# Patient Record
Sex: Female | Born: 1935 | Race: White | Hispanic: No | Marital: Single | Smoking: Never smoker
Health system: Southern US, Community
[De-identification: ages and names within clinical notes are randomized; demographics above are authoritative.]

## PROBLEM LIST (undated history)

## (undated) DIAGNOSIS — R42 Dizziness and giddiness: Secondary | ICD-10-CM

---

## 2007-09-24 ENCOUNTER — Encounter: Admission: RE | Admit: 2007-09-24 | Discharge: 2007-12-23 | Payer: Self-pay | Admitting: Family Medicine

## 2010-08-30 DEATH — deceased

## 2012-02-19 DIAGNOSIS — I10 Essential (primary) hypertension: Secondary | ICD-10-CM | POA: Diagnosis not present

## 2012-02-19 DIAGNOSIS — Z1331 Encounter for screening for depression: Secondary | ICD-10-CM | POA: Diagnosis not present

## 2012-02-19 DIAGNOSIS — E7800 Pure hypercholesterolemia, unspecified: Secondary | ICD-10-CM | POA: Diagnosis not present

## 2012-02-19 DIAGNOSIS — F411 Generalized anxiety disorder: Secondary | ICD-10-CM | POA: Diagnosis not present

## 2012-03-13 ENCOUNTER — Encounter (HOSPITAL_BASED_OUTPATIENT_CLINIC_OR_DEPARTMENT_OTHER): Payer: Self-pay | Admitting: *Deleted

## 2012-03-13 ENCOUNTER — Emergency Department (HOSPITAL_BASED_OUTPATIENT_CLINIC_OR_DEPARTMENT_OTHER)
Admission: EM | Admit: 2012-03-13 | Discharge: 2012-03-13 | Disposition: A | Discharge status: Home / Self Care | Payer: Medicare Other | Attending: Emergency Medicine | Admitting: Emergency Medicine

## 2012-03-13 ENCOUNTER — Emergency Department (HOSPITAL_BASED_OUTPATIENT_CLINIC_OR_DEPARTMENT_OTHER): Payer: Medicare Other

## 2012-03-13 DIAGNOSIS — E7800 Pure hypercholesterolemia, unspecified: Secondary | ICD-10-CM | POA: Diagnosis not present

## 2012-03-13 DIAGNOSIS — I10 Essential (primary) hypertension: Secondary | ICD-10-CM | POA: Insufficient documentation

## 2012-03-13 DIAGNOSIS — S82899A Other fracture of unspecified lower leg, initial encounter for closed fracture: Secondary | ICD-10-CM | POA: Diagnosis not present

## 2012-03-13 DIAGNOSIS — M25579 Pain in unspecified ankle and joints of unspecified foot: Secondary | ICD-10-CM | POA: Insufficient documentation

## 2012-03-13 DIAGNOSIS — S82892A Other fracture of left lower leg, initial encounter for closed fracture: Secondary | ICD-10-CM

## 2012-03-13 DIAGNOSIS — M7989 Other specified soft tissue disorders: Secondary | ICD-10-CM | POA: Diagnosis not present

## 2012-03-13 DIAGNOSIS — S8263XA Displaced fracture of lateral malleolus of unspecified fibula, initial encounter for closed fracture: Secondary | ICD-10-CM | POA: Diagnosis not present

## 2012-03-13 DIAGNOSIS — X500XXA Overexertion from strenuous movement or load, initial encounter: Secondary | ICD-10-CM | POA: Insufficient documentation

## 2012-03-13 MED ORDER — HYDROCODONE-ACETAMINOPHEN 5-325 MG PO TABS: 1.0000 | ORAL_TABLET | Freq: Four times a day (QID) | ORAL | Status: AC | PRN

## 2012-03-13 NOTE — ED Notes (Signed)
Pt initially refused splint and crutches-in with EDP [place] to talk with pt-pt agreeable to splint and rx for walker

## 2012-03-13 NOTE — ED Notes (Signed)
Pt refused w/c to tx area-slow steady gait

## 2012-03-13 NOTE — ED Provider Notes (Signed)
History     CSN: [id number]  Arrival date & time 03/18/12  1859   First MD Initiated Contact with Patient Mar 18, 2012 1910      Chief Complaint  Patient presents with  . Ankle Injury    (Consider location/radiation/quality/duration/timing/severity/associated sxs/prior treatment) HPI This is a 78 year old white female who was getting up out of her bed just prior to arrival. In the process she hyperextended her left ankle. There is now moderate pain in the left ankle with ecchymosis and swelling. Pain is worse with movement or palpation. She is able to bear weight on it. She denies other injury.  Past Medical History  Diagnosis Date  . Hypertension   . High cholesterol   . Anxiety     History reviewed. No pertinent past surgical history.  No family history on file.  History  Substance Use Topics  . Smoking status: Never Smoker   . Smokeless tobacco: Not on file  . Alcohol Use: No    OB History    Grav Para Term Preterm Abortions TAB SAB Ect Mult Living                  Review of Systems  All other systems reviewed and are negative.    Allergies  Review of patient's allergies indicates not on file.  Home Medications  No current outpatient prescriptions on file.  BP 199/78  Pulse 75  Temp(Src) 98.7 F (37.1 C) (Oral)  Resp 16  Ht 5\' 5"  (1.651 m)  Wt 141 lb (63.957 kg)  BMI 23.46 kg/m2  SpO2 98%  Physical Exam General: Well-developed, well-nourished female in no acute distress; appearance consistent with age of record HENT: normocephalic, atraumatic Eyes: pupils equal round and reactive to light; extraocular muscles intact Neck: supple Heart: regular rate and rhythm Lungs: clear to auscultation bilaterally Abdomen: soft; nondistended Extremities: Pulses normal; tenderness, ecchymosis and swelling over left lateral malleolus; left foot distally neurovascularly intact Neurologic: Awake, alert and oriented; motor function intact in all extremities and  symmetric; no facial droop Skin: Warm and dry     ED Course  Procedures (including critical care time)     MDM  Nursing notes and vitals signs, including pulse oximetry, reviewed.  Summary of this visit's results, reviewed by myself:  Labs:  No results found for this or any previous visit.  Imaging Studies: Dg Ankle Complete Left  Mar 18, 2012  *RADIOLOGY REPORT*  Clinical Data: Lateral ankle pain with bruising and swelling.  LEFT ANKLE COMPLETE - 3+ VIEW  Comparison: None.  Findings: There is prominent soft tissue swelling over the anterolateral aspect of the ankle. Widening of the lateral aspect of the tibiotalar joint.  Possible tiny avulsion fracture off the inferior aspect of the lateral malleolus.  IMPRESSION:  1.  Marked soft tissue swelling over the anterolateral aspect of the ankle with a possible tiny avulsion fracture off the inferior aspect of the lateral malleolus. 2.  Widening of the lateral aspect of the tibiotalar joint.  Original Report Authenticated By: Reyes Ivan, M.D.            Hanley Seamen, MD Mar 18, 2012 [phone]

## 2012-03-13 NOTE — ED Notes (Signed)
Twisted left ankle when stood approx PTA

## 2012-03-13 NOTE — Discharge Instructions (Signed)
Ankle Fracture A fracture is a break in the bone. A cast or splint is used to protect and keep your injured bone from moving.  HOME CARE INSTRUCTIONS   Use your crutches as directed.   To lessen the swelling, keep the injured leg elevated while sitting or lying down.   Apply ice to the injury for 15 to 20 minutes, 3 to 4 times per day while awake for 2 days. Put the ice in a plastic bag and place a thin towel between the bag of ice and your cast.   If you have a plaster or fiberglass cast:   Do not try to scratch the skin under the cast using sharp or pointed objects.   Check the skin around the cast every day. You may put lotion on any red or sore areas.   Keep your cast dry and clean.   If you have a plaster splint:   Wear the splint as directed.   You may loosen the elastic around the splint if your toes become numb, tingle, or turn cold or blue.   Do not put pressure on any part of your cast or splint; it may break. Rest your cast only on a pillow the first 24 hours until it is fully hardened.   Your cast or splint can be protected during bathing with a plastic bag. Do not lower the cast or splint into water.   Take medications as directed by your caregiver. Only take over-the-counter or prescription medicines for pain, discomfort, or fever as directed by your caregiver.   Do not drive a vehicle until your caregiver specifically tells you it is safe to do so.   If your caregiver has given you a follow-up appointment, it is very important to keep that appointment. Not keeping the appointment could result in a chronic or permanent injury, pain, and disability. If there is any problem keeping the appointment, you must call back to this facility for assistance.  SEEK IMMEDIATE MEDICAL CARE IF:   Your splint gets damaged or breaks.   You have continued severe pain or more swelling than you did before the cast was put on.   Your skin or toenails below the injury turn blue or  gray, or feel cold or numb.   There is a bad smell or new stains and/or purulent (pus like) drainage coming from under the cast.  If you do not have a window in your cast for observing the wound, a discharge or minor bleeding may show up as a stain on the outside of your cast. Report these findings to your caregiver. MAKE SURE YOU:   Understand these instructions.   Will watch your condition.   Will get help right away if you are not doing well or get worse.  Document Released: 10/13/2000 Document Revised: 10/05/2011 Document Reviewed: 05/19/2008 [facility] Patient Information 2012 [place], [place].

## 2012-03-13 NOTE — ED Notes (Signed)
Pt cont'd anxious-advised daughter to recheck BP when pt less anxious-agreeable and states pt has home BP monitor-pt assisted to BR via w/c just prior to d/c

## 2012-03-15 DIAGNOSIS — S96819A Strain of other specified muscles and tendons at ankle and foot level, unspecified foot, initial encounter: Secondary | ICD-10-CM | POA: Diagnosis not present

## 2012-03-15 DIAGNOSIS — S93499A Sprain of other ligament of unspecified ankle, initial encounter: Secondary | ICD-10-CM | POA: Diagnosis not present

## 2012-03-27 DIAGNOSIS — H4050X0 Glaucoma secondary to other eye disorders, unspecified eye, stage unspecified: Secondary | ICD-10-CM | POA: Diagnosis not present

## 2012-03-27 DIAGNOSIS — H2510 Age-related nuclear cataract, unspecified eye: Secondary | ICD-10-CM | POA: Diagnosis not present

## 2012-03-27 DIAGNOSIS — H409 Unspecified glaucoma: Secondary | ICD-10-CM | POA: Diagnosis not present

## 2012-04-01 DIAGNOSIS — H4050X0 Glaucoma secondary to other eye disorders, unspecified eye, stage unspecified: Secondary | ICD-10-CM | POA: Diagnosis not present

## 2012-04-03 DIAGNOSIS — H2510 Age-related nuclear cataract, unspecified eye: Secondary | ICD-10-CM | POA: Diagnosis not present

## 2012-04-03 DIAGNOSIS — S96819A Strain of other specified muscles and tendons at ankle and foot level, unspecified foot, initial encounter: Secondary | ICD-10-CM | POA: Diagnosis not present

## 2012-04-03 DIAGNOSIS — S93499A Sprain of other ligament of unspecified ankle, initial encounter: Secondary | ICD-10-CM | POA: Diagnosis not present

## 2012-04-10 DIAGNOSIS — IMO0002 Reserved for concepts with insufficient information to code with codable children: Secondary | ICD-10-CM | POA: Diagnosis not present

## 2012-04-10 DIAGNOSIS — H2510 Age-related nuclear cataract, unspecified eye: Secondary | ICD-10-CM | POA: Diagnosis not present

## 2012-04-10 DIAGNOSIS — H269 Unspecified cataract: Secondary | ICD-10-CM | POA: Diagnosis not present

## 2012-04-18 DIAGNOSIS — H2510 Age-related nuclear cataract, unspecified eye: Secondary | ICD-10-CM | POA: Diagnosis not present

## 2012-05-13 DIAGNOSIS — H2510 Age-related nuclear cataract, unspecified eye: Secondary | ICD-10-CM | POA: Diagnosis not present

## 2012-05-13 DIAGNOSIS — IMO0002 Reserved for concepts with insufficient information to code with codable children: Secondary | ICD-10-CM | POA: Diagnosis not present

## 2012-05-13 DIAGNOSIS — H269 Unspecified cataract: Secondary | ICD-10-CM | POA: Diagnosis not present

## 2012-05-13 DIAGNOSIS — H278 Other specified disorders of lens: Secondary | ICD-10-CM | POA: Diagnosis not present

## 2012-05-14 ENCOUNTER — Encounter (INDEPENDENT_AMBULATORY_CARE_PROVIDER_SITE_OTHER): Payer: Medicare Other | Admitting: Ophthalmology

## 2012-05-14 DIAGNOSIS — I10 Essential (primary) hypertension: Secondary | ICD-10-CM | POA: Diagnosis not present

## 2012-05-14 DIAGNOSIS — H35039 Hypertensive retinopathy, unspecified eye: Secondary | ICD-10-CM

## 2012-05-14 DIAGNOSIS — H53009 Unspecified amblyopia, unspecified eye: Secondary | ICD-10-CM

## 2012-05-14 DIAGNOSIS — H43819 Vitreous degeneration, unspecified eye: Secondary | ICD-10-CM

## 2012-05-14 DIAGNOSIS — H1820 Unspecified corneal edema: Secondary | ICD-10-CM | POA: Diagnosis not present

## 2012-07-08 DIAGNOSIS — Z23 Encounter for immunization: Secondary | ICD-10-CM | POA: Diagnosis not present

## 2012-08-12 DIAGNOSIS — H40039 Anatomical narrow angle, unspecified eye: Secondary | ICD-10-CM | POA: Diagnosis not present

## 2012-08-23 DIAGNOSIS — D485 Neoplasm of uncertain behavior of skin: Secondary | ICD-10-CM | POA: Diagnosis not present

## 2012-08-23 DIAGNOSIS — I10 Essential (primary) hypertension: Secondary | ICD-10-CM | POA: Diagnosis not present

## 2012-08-23 DIAGNOSIS — F411 Generalized anxiety disorder: Secondary | ICD-10-CM | POA: Diagnosis not present

## 2012-08-23 DIAGNOSIS — D0439 Carcinoma in situ of skin of other parts of face: Secondary | ICD-10-CM | POA: Diagnosis not present

## 2012-08-23 DIAGNOSIS — E7800 Pure hypercholesterolemia, unspecified: Secondary | ICD-10-CM | POA: Diagnosis not present

## 2012-09-19 DIAGNOSIS — D049 Carcinoma in situ of skin, unspecified: Secondary | ICD-10-CM | POA: Diagnosis not present

## 2012-09-19 DIAGNOSIS — L570 Actinic keratosis: Secondary | ICD-10-CM | POA: Diagnosis not present

## 2013-02-13 DIAGNOSIS — H4011X0 Primary open-angle glaucoma, stage unspecified: Secondary | ICD-10-CM | POA: Diagnosis not present

## 2013-02-13 DIAGNOSIS — H409 Unspecified glaucoma: Secondary | ICD-10-CM | POA: Diagnosis not present

## 2013-02-21 DIAGNOSIS — E7800 Pure hypercholesterolemia, unspecified: Secondary | ICD-10-CM | POA: Diagnosis not present

## 2013-02-21 DIAGNOSIS — I10 Essential (primary) hypertension: Secondary | ICD-10-CM | POA: Diagnosis not present

## 2013-02-21 DIAGNOSIS — Z1331 Encounter for screening for depression: Secondary | ICD-10-CM | POA: Diagnosis not present

## 2013-02-21 DIAGNOSIS — M159 Polyosteoarthritis, unspecified: Secondary | ICD-10-CM | POA: Diagnosis not present

## 2013-02-21 DIAGNOSIS — F411 Generalized anxiety disorder: Secondary | ICD-10-CM | POA: Diagnosis not present

## 2013-06-19 DIAGNOSIS — H409 Unspecified glaucoma: Secondary | ICD-10-CM | POA: Diagnosis not present

## 2013-06-19 DIAGNOSIS — H4011X0 Primary open-angle glaucoma, stage unspecified: Secondary | ICD-10-CM | POA: Diagnosis not present

## 2013-07-09 DIAGNOSIS — Z23 Encounter for immunization: Secondary | ICD-10-CM | POA: Diagnosis not present

## 2013-08-04 DIAGNOSIS — I10 Essential (primary) hypertension: Secondary | ICD-10-CM | POA: Diagnosis not present

## 2013-08-04 DIAGNOSIS — F411 Generalized anxiety disorder: Secondary | ICD-10-CM | POA: Diagnosis not present

## 2013-08-04 DIAGNOSIS — E7800 Pure hypercholesterolemia, unspecified: Secondary | ICD-10-CM | POA: Diagnosis not present

## 2013-08-06 DIAGNOSIS — H409 Unspecified glaucoma: Secondary | ICD-10-CM | POA: Diagnosis not present

## 2013-08-06 DIAGNOSIS — H4011X0 Primary open-angle glaucoma, stage unspecified: Secondary | ICD-10-CM | POA: Diagnosis not present

## 2013-09-17 DIAGNOSIS — H40039 Anatomical narrow angle, unspecified eye: Secondary | ICD-10-CM | POA: Diagnosis not present

## 2013-10-24 DIAGNOSIS — F411 Generalized anxiety disorder: Secondary | ICD-10-CM | POA: Diagnosis not present

## 2013-10-24 DIAGNOSIS — R42 Dizziness and giddiness: Secondary | ICD-10-CM | POA: Diagnosis not present

## 2013-12-22 DIAGNOSIS — H4011X0 Primary open-angle glaucoma, stage unspecified: Secondary | ICD-10-CM | POA: Diagnosis not present

## 2013-12-22 DIAGNOSIS — H409 Unspecified glaucoma: Secondary | ICD-10-CM | POA: Diagnosis not present

## 2014-02-02 DIAGNOSIS — F411 Generalized anxiety disorder: Secondary | ICD-10-CM | POA: Diagnosis not present

## 2014-02-02 DIAGNOSIS — E7800 Pure hypercholesterolemia, unspecified: Secondary | ICD-10-CM | POA: Diagnosis not present

## 2014-02-02 DIAGNOSIS — I10 Essential (primary) hypertension: Secondary | ICD-10-CM | POA: Diagnosis not present

## 2014-04-23 DIAGNOSIS — H409 Unspecified glaucoma: Secondary | ICD-10-CM | POA: Diagnosis not present

## 2014-04-23 DIAGNOSIS — H4011X0 Primary open-angle glaucoma, stage unspecified: Secondary | ICD-10-CM | POA: Diagnosis not present

## 2014-07-07 DIAGNOSIS — Z23 Encounter for immunization: Secondary | ICD-10-CM | POA: Diagnosis not present

## 2014-08-13 DIAGNOSIS — I493 Ventricular premature depolarization: Secondary | ICD-10-CM | POA: Diagnosis not present

## 2014-08-13 DIAGNOSIS — I10 Essential (primary) hypertension: Secondary | ICD-10-CM | POA: Diagnosis not present

## 2014-08-13 DIAGNOSIS — F419 Anxiety disorder, unspecified: Secondary | ICD-10-CM | POA: Diagnosis not present

## 2014-08-13 DIAGNOSIS — M150 Primary generalized (osteo)arthritis: Secondary | ICD-10-CM | POA: Diagnosis not present

## 2014-08-13 DIAGNOSIS — E780 Pure hypercholesterolemia: Secondary | ICD-10-CM | POA: Diagnosis not present

## 2014-10-01 DIAGNOSIS — H04123 Dry eye syndrome of bilateral lacrimal glands: Secondary | ICD-10-CM | POA: Diagnosis not present

## 2014-10-01 DIAGNOSIS — H4011X1 Primary open-angle glaucoma, mild stage: Secondary | ICD-10-CM | POA: Diagnosis not present

## 2015-02-11 DIAGNOSIS — I10 Essential (primary) hypertension: Secondary | ICD-10-CM | POA: Diagnosis not present

## 2015-02-11 DIAGNOSIS — F419 Anxiety disorder, unspecified: Secondary | ICD-10-CM | POA: Diagnosis not present

## 2015-02-11 DIAGNOSIS — E780 Pure hypercholesterolemia: Secondary | ICD-10-CM | POA: Diagnosis not present

## 2015-04-06 DIAGNOSIS — H4011X1 Primary open-angle glaucoma, mild stage: Secondary | ICD-10-CM | POA: Diagnosis not present

## 2015-05-07 ENCOUNTER — Emergency Department (HOSPITAL_BASED_OUTPATIENT_CLINIC_OR_DEPARTMENT_OTHER)
Admission: EM | Admit: 2015-05-07 | Discharge: 2015-05-07 | Disposition: A | Discharge status: Home / Self Care | Payer: Medicare Other | Attending: Emergency Medicine | Admitting: Emergency Medicine

## 2015-05-07 ENCOUNTER — Encounter (HOSPITAL_BASED_OUTPATIENT_CLINIC_OR_DEPARTMENT_OTHER): Payer: Self-pay | Admitting: *Deleted

## 2015-05-07 DIAGNOSIS — I10 Essential (primary) hypertension: Secondary | ICD-10-CM | POA: Diagnosis not present

## 2015-05-07 DIAGNOSIS — Y92008 Other place in unspecified non-institutional (private) residence as the place of occurrence of the external cause: Secondary | ICD-10-CM | POA: Diagnosis not present

## 2015-05-07 DIAGNOSIS — Z79899 Other long term (current) drug therapy: Secondary | ICD-10-CM | POA: Insufficient documentation

## 2015-05-07 DIAGNOSIS — Y9389 Activity, other specified: Secondary | ICD-10-CM | POA: Diagnosis not present

## 2015-05-07 DIAGNOSIS — E780 Pure hypercholesterolemia: Secondary | ICD-10-CM | POA: Diagnosis not present

## 2015-05-07 DIAGNOSIS — Y998 Other external cause status: Secondary | ICD-10-CM | POA: Insufficient documentation

## 2015-05-07 DIAGNOSIS — Z7982 Long term (current) use of aspirin: Secondary | ICD-10-CM | POA: Insufficient documentation

## 2015-05-07 DIAGNOSIS — Z880 Allergy status to penicillin: Secondary | ICD-10-CM | POA: Diagnosis not present

## 2015-05-07 DIAGNOSIS — S81812A Laceration without foreign body, left lower leg, initial encounter: Secondary | ICD-10-CM | POA: Diagnosis not present

## 2015-05-07 DIAGNOSIS — F419 Anxiety disorder, unspecified: Secondary | ICD-10-CM | POA: Insufficient documentation

## 2015-05-07 DIAGNOSIS — S81802A Unspecified open wound, left lower leg, initial encounter: Secondary | ICD-10-CM | POA: Insufficient documentation

## 2015-05-07 DIAGNOSIS — W108XXA Fall (on) (from) other stairs and steps, initial encounter: Secondary | ICD-10-CM | POA: Diagnosis not present

## 2015-05-07 NOTE — ED Provider Notes (Signed)
CSN: [id number]     Arrival date & time 05/07/15  1731 History   First MD Initiated Contact with Patient 05/07/15 1821     Chief Complaint  Patient presents with  . Extremity Laceration     (Consider location/radiation/quality/duration/timing/severity/associated sxs/prior Treatment) The history is provided by the patient and medical records.   81 y.o. F with hx of HTN, HLP, anxiety, presenting to the ED for left lower leg abrasions that happened yesterday.  Patient states she was carrying her small cocker spaniel of the stairs when she tripped and hit her shin against the concrete steps of her porch. She denies any head injury or loss of consciousness. She states her neighbor helped bandage her wounds, but she has had issues of intermittent bleeding since this time. She denies any leg pain or difficulty walking. No numbness or weakness.  Patient cannot receive tetanus vaccinations as she has an allergic reaction.  Past Medical History  Diagnosis Date  . Hypertension   . High cholesterol   . Anxiety    History reviewed. No pertinent past surgical history. History reviewed. No pertinent family history. History  Substance Use Topics  . Smoking status: Never Smoker   . Smokeless tobacco: Not on file  . Alcohol Use: No   OB History    No data available     Review of Systems  Skin: Positive for wound.  All other systems reviewed and are negative.     Allergies  Penicillins and Tetanus toxoids  Home Medications   Prior to Admission medications   Medication Sig Start Date End Date Taking? Authorizing Provider  aspirin 81 MG tablet Take 81 mg by mouth daily.    Historical Provider, MD  clorazepate (TRANXENE) 7.5 MG tablet Take 7.5 mg by mouth 2 (two) times daily as needed.    Historical Provider, MD  pravastatin (PRAVACHOL) 20 MG tablet Take 20 mg by mouth daily.    Historical Provider, MD  verapamil (CALAN-SR) 120 MG CR tablet Take 120 mg by mouth at bedtime.    Historical  Provider, MD   BP 187/89 mmHg  Pulse 46  Temp(Src) 98.4 F (36.9 C) (Oral)  Resp 16  Ht 5\' 5"  (1.651 m)  Wt 140 lb (63.504 kg)  BMI 23.30 kg/m2  SpO2 98%   Physical Exam  Constitutional: She is oriented to person, place, and time. She appears well-developed and well-nourished. No distress.  HENT:  Head: Normocephalic and atraumatic.  Mouth/Throat: Oropharynx is clear and moist.  Eyes: Conjunctivae and EOM are normal. Pupils are equal, round, and reactive to light.  Neck: Normal range of motion. Neck supple.  Cardiovascular: Normal rate, regular rhythm and normal heart sounds.   Pulmonary/Chest: Effort normal and breath sounds normal. No respiratory distress. She has no wheezes.  Musculoskeletal: Normal range of motion.  Left anterior shin with approximately 6 cm linear abrasion without active bleeding; there is no bruising, swelling, or bony deformity noted; leg is NVI; normal gait  Neurological: She is alert and oriented to person, place, and time.  Skin: Skin is warm and dry. She is not diaphoretic.  Psychiatric: She has a normal mood and affect.  Nursing note and vitals reviewed.   ED Course  Procedures (including critical care time)  LACERATION REPAIR Performed by: Larene Pickett Authorized by: Larene Pickett Consent: Verbal consent obtained. Risks and benefits: risks, benefits and alternatives were discussed Consent given by: patient Patient identity confirmed: provided demographic data Prepped and Draped in normal sterile  fashion Wound explored  Laceration Location: left shin  Laceration Length: 6cm, superficial abrasion  No Foreign Bodies seen or palpated  Anesthesia: none  Local anesthetic: none  Anesthetic total: 0 ml  Irrigation method: syringe Amount of cleaning: standard  Skin closure: steri-strips  Number of sutures: 0  Technique: n/a  Patient tolerance: Patient tolerated the procedure well with no immediate complications.  Labs  Review Labs Reviewed - No data to display  Imaging Review No results found.   EKG Interpretation None      MDM   Final diagnoses:  Leg wound, left, initial encounter   81 year old female with abrasions to left lower leg from a mechanical fall on steps yesterday. No head injury or loss of consciousness. She denies any leg pain, numbness, or weakness. Gait is normal. Abrasions not requiring sutures and wounds > 24 hours old at this time.  Wounds cleansed and Steri-Strips applied, bandage placed.  Instructed on home wound care.  Patient cannot receive tetanus vaccine.  Will FU with PCP as needed.  Discussed plan with patient, he/she acknowledged understanding and agreed with plan of care.  Return precautions given for new or worsening symptoms.  Larene Pickett, PA-C 05/07/15 [place], MD 05/07/15 [phone]

## 2015-05-07 NOTE — ED Notes (Signed)
PA at bedside.

## 2015-05-07 NOTE — ED Notes (Signed)
Wound care completed by PA.  Wound is dressed and pt is ready to go.

## 2015-05-07 NOTE — Discharge Instructions (Signed)
Leave Steri-Strips in place for the next several days. Change dressing as needed at home. Return here as needed for any new concerns.

## 2015-05-07 NOTE — ED Notes (Signed)
Pt c/o laceration to left lower leg x 1day ago

## 2015-07-20 DIAGNOSIS — Z23 Encounter for immunization: Secondary | ICD-10-CM | POA: Diagnosis not present

## 2015-08-16 DIAGNOSIS — E7800 Pure hypercholesterolemia, unspecified: Secondary | ICD-10-CM | POA: Diagnosis not present

## 2015-08-16 DIAGNOSIS — F430 Acute stress reaction: Secondary | ICD-10-CM | POA: Diagnosis not present

## 2015-08-16 DIAGNOSIS — I10 Essential (primary) hypertension: Secondary | ICD-10-CM | POA: Diagnosis not present

## 2015-08-16 DIAGNOSIS — F419 Anxiety disorder, unspecified: Secondary | ICD-10-CM | POA: Diagnosis not present

## 2015-09-28 ENCOUNTER — Emergency Department (HOSPITAL_BASED_OUTPATIENT_CLINIC_OR_DEPARTMENT_OTHER)
Admission: EM | Admit: 2015-09-28 | Discharge: 2015-09-28 | Disposition: A | Discharge status: Home / Self Care | Payer: Medicare Other | Attending: Emergency Medicine | Admitting: Emergency Medicine

## 2015-09-28 ENCOUNTER — Emergency Department (HOSPITAL_BASED_OUTPATIENT_CLINIC_OR_DEPARTMENT_OTHER): Payer: Medicare Other

## 2015-09-28 ENCOUNTER — Encounter (HOSPITAL_BASED_OUTPATIENT_CLINIC_OR_DEPARTMENT_OTHER): Payer: Self-pay | Admitting: *Deleted

## 2015-09-28 DIAGNOSIS — E7800 Pure hypercholesterolemia, unspecified: Secondary | ICD-10-CM | POA: Insufficient documentation

## 2015-09-28 DIAGNOSIS — S42202A Unspecified fracture of upper end of left humerus, initial encounter for closed fracture: Secondary | ICD-10-CM | POA: Diagnosis not present

## 2015-09-28 DIAGNOSIS — S42292A Other displaced fracture of upper end of left humerus, initial encounter for closed fracture: Secondary | ICD-10-CM | POA: Diagnosis not present

## 2015-09-28 DIAGNOSIS — Z7982 Long term (current) use of aspirin: Secondary | ICD-10-CM | POA: Diagnosis not present

## 2015-09-28 DIAGNOSIS — W010XXA Fall on same level from slipping, tripping and stumbling without subsequent striking against object, initial encounter: Secondary | ICD-10-CM | POA: Diagnosis not present

## 2015-09-28 DIAGNOSIS — Z880 Allergy status to penicillin: Secondary | ICD-10-CM | POA: Insufficient documentation

## 2015-09-28 DIAGNOSIS — Y998 Other external cause status: Secondary | ICD-10-CM | POA: Diagnosis not present

## 2015-09-28 DIAGNOSIS — S4992XA Unspecified injury of left shoulder and upper arm, initial encounter: Secondary | ICD-10-CM | POA: Diagnosis present

## 2015-09-28 DIAGNOSIS — Y93K1 Activity, walking an animal: Secondary | ICD-10-CM | POA: Insufficient documentation

## 2015-09-28 DIAGNOSIS — F419 Anxiety disorder, unspecified: Secondary | ICD-10-CM | POA: Diagnosis not present

## 2015-09-28 DIAGNOSIS — Z79899 Other long term (current) drug therapy: Secondary | ICD-10-CM | POA: Insufficient documentation

## 2015-09-28 DIAGNOSIS — I10 Essential (primary) hypertension: Secondary | ICD-10-CM | POA: Diagnosis not present

## 2015-09-28 DIAGNOSIS — Y9289 Other specified places as the place of occurrence of the external cause: Secondary | ICD-10-CM | POA: Insufficient documentation

## 2015-09-28 HISTORY — DX: Dizziness and giddiness: R42

## 2015-09-28 MED ORDER — IBUPROFEN 200 MG PO TABS
ORAL_TABLET | ORAL | Status: AC
  Filled 2015-09-28: qty 1

## 2015-09-28 MED ORDER — POLYETHYLENE GLYCOL 3350 17 G PO PACK: 17.0000 g | PACK | Freq: Every day | ORAL | Status: AC

## 2015-09-28 MED ORDER — TRAMADOL HCL 50 MG PO TABS: 50.0000 mg | ORAL_TABLET | Freq: Four times a day (QID) | ORAL | Status: DC | PRN

## 2015-09-28 MED ORDER — IBUPROFEN 400 MG PO TABS
ORAL_TABLET | ORAL | Status: AC
  Filled 2015-09-28: qty 1

## 2015-09-28 MED ORDER — IBUPROFEN 400 MG PO TABS
600.0000 mg | ORAL_TABLET | Freq: Once | ORAL | Status: AC
  Administered 2015-09-28: 600 mg via ORAL

## 2015-09-28 MED ORDER — DOCUSATE SODIUM 100 MG PO CAPS: 100.0000 mg | ORAL_CAPSULE | Freq: Two times a day (BID) | ORAL | Status: AC

## 2015-09-28 NOTE — ED Provider Notes (Signed)
CSN: [id number]     Arrival date & time 09/28/15  [id number] History   First MD Initiated Contact with Patient 09/28/15 0902     Chief Complaint  Patient presents with  . Fall     (Consider location/radiation/quality/duration/timing/severity/associated sxs/prior Treatment) The history is provided by the patient.   patient presents after a fall. States around 2 in the morning she was walking her dog and tripped and fell into the door frame. Comparing of pain in the left shoulder. No other injury. No numbness weakness. She states she did not hit her head. Pain with movement of the left shoulder. No chest pain or trouble breathing. No headache. No confusion. She is not on anticoagulation.  Past Medical History  Diagnosis Date  . Hypertension   . High cholesterol   . Anxiety   . Dizziness    History reviewed. No pertinent past surgical history. No family history on file. Social History  Substance Use Topics  . Smoking status: Never Smoker   . Smokeless tobacco: None  . Alcohol Use: No   OB History    No data available     Review of Systems  Constitutional: Negative for appetite change.  Respiratory: Negative for shortness of breath.   Cardiovascular: Negative for chest pain.  Musculoskeletal: Positive for joint swelling. Negative for back pain and gait problem.  Skin: Negative for wound.  Neurological: Negative for weakness and light-headedness.      Allergies  Penicillins and Tetanus toxoids  Home Medications   Prior to Admission medications   Medication Sig Start Date End Date Taking? Authorizing Provider  amLODipine-benazepril (LOTREL) 5-20 MG capsule Take 1 capsule by mouth daily.   Yes Historical Provider, MD  aspirin 81 MG tablet Take 81 mg by mouth daily.   Yes Historical Provider, MD  clorazepate (TRANXENE) 7.5 MG tablet Take 7.5 mg by mouth 2 (two) times daily as needed.   Yes Historical Provider, MD  pravastatin (PRAVACHOL) 20 MG tablet Take 20 mg by mouth daily.    Yes Historical Provider, MD  docusate sodium (COLACE) 100 MG capsule Take 1 capsule (100 mg total) by mouth every 12 (twelve) hours. 09/28/15   Davonna Belling, MD  polyethylene glycol [facility] / GLYCOLAX) packet Take 17 g by mouth daily. 09/28/15   Davonna Belling, MD  traMADol (ULTRAM) 50 MG tablet Take 1 tablet (50 mg total) by mouth every 6 (six) hours as needed. 09/28/15   Davonna Belling, MD   BP 170/85 mmHg  Pulse 92  Temp(Src) 98.3 F (36.8 C) (Oral)  Resp 16  Ht 5\' 5"  (1.651 m)  Wt 130 lb (58.968 kg)  BMI 21.63 kg/m2  SpO2 99% Physical Exam  Constitutional: She appears well-developed.  HENT:  Head: Atraumatic.  Neck: Normal range of motion. Neck supple.  Cardiovascular: Normal rate.   Pulmonary/Chest: Effort normal.  Abdominal: Soft.  Musculoskeletal: She exhibits tenderness.  Tenderness and swelling to left proximal upper arm. Some fullness in the deltoid area. Neurovascular intact in hand. No tenderness of elbow. Decreased range of motion shoulder with some grinding.  Neurological: She is alert.  Skin: Skin is warm.    ED Course  Procedures (including critical care time) Labs Review Labs Reviewed - No data to display  Imaging Review Ct Shoulder Left Wo Contrast  09/28/2015  CLINICAL DATA:  Evaluate proximal humeral head/ neck fracture, left shoulder. EXAM: CT OF THE LEFT SHOULDER WITHOUT CONTRAST TECHNIQUE: Multidetector CT imaging was performed according to the standard protocol. Multiplanar CT  image reconstructions were also generated. COMPARISON:  Radiographs 09/28/2015 FINDINGS: There is a severely comminuted and complex fracture involving the humeral head and neck. There is a transverse fracture through the humeral neck and a vertical fracture through the greater tuberosity. The lesser tuberosity is also fractured. The humeral head is split and the largest component of the articular surface is rotated posteriorly and impacted in the humeral neck. Multiple  loose bone fragments are noted in the joint space. Possible small fracture involving the superior margin of the glenoid. The [facility] joint is intact with moderate degenerative changes. No scapular fracture. No rib fractures are seen. The visualized left lung is unremarkable. IMPRESSION: Severely comminuted and complex fracture involving the humeral head neck. Multipart fracture with a head split. The largest component of the articular surface is rotated posteriorly and not articulating with the glenoid. Please see 3D images. Multiple small fracture fragments in the joint. Possible small fracture involving the superior margin of the glenoid posteriorly. Electronically Signed   By: Marijo Sanes M.D.   On: 09/28/2015 11:43   Dg Shoulder Left  09/28/2015  CLINICAL DATA:  Fall today. EXAM: LEFT SHOULDER - 2+ VIEW COMPARISON:  None. FINDINGS: . Acute fracture of the left humeral neck and left humeral head. There is impaction of the humeral head. Subluxation of the shoulder joint without dislocation. No other fracture. AC joint intact. IMPRESSION: Impacted fracture of the left humeral head. Fracture of the left humeral neck. Consider CT for further anatomic evaluation. Electronically Signed   By: Franchot Gallo M.D.   On: 09/28/2015 09:25   I have personally reviewed and evaluated these images and lab results as part of my medical decision-making.   EKG Interpretation None      MDM   Final diagnoses:  Proximal humerus fracture, left, closed, initial encounter   Patient with complex humeral fracture. Discussed with Dr. Amedeo Plenty, who suggested follow-up with Dr. suppler Dr. Tamela Oddi. CT scan was done here for operative planning. Will discharge with stool softeners and some pain medicine. Patient was placed in a shoulder immobilizer.     Davonna Belling, MD 09/28/15 [phone]

## 2015-09-28 NOTE — ED Notes (Signed)
MD at bedside. 

## 2015-09-28 NOTE — Discharge Instructions (Signed)
We have discussed with Dr Amedeo Plenty. He requests follow up with Dr Onnie Graham or Dr Veverly Fells.  Shoulder Fracture (Proximal Humerus or Glenoid) A shoulder fracture is a broken upper arm bone or a broken socket bone. The humerus is the upper arm bone and the glenoid is the shoulder socket. Proximal means the humerus is broken near the shoulder. Most of the time the bones of a broken shoulder are in an acceptable position. Usually, the injury can be treated with a shoulder immobilizer or sling and swath bandage. These devices support the arm and prevent any shoulder movement. If the bones are not in a good position, then surgery is sometimes needed. Shoulder fractures usually initially cause swelling, pain, and discoloration around the upper arm. They heal in 8 to 12 weeks with proper treatment. SYMPTOMS  At the time of injury:  Pain.  Tenderness.  Regular body contours are not normal. Later symptoms may include:  Swelling and bruising of the elbow and hand.  Swelling and bruising of the arm or chest. Other symptoms include:  Pain when lifting or turning the arm.  Paralysis below the fracture.  Numbness or coldness below the fracture. CAUSES   Indirect force from falling on an outstretched arm.  A blow to the shoulder. RISK INCREASES WITH:  Not being in shape.  Playing contact sports, such as football, soccer, hockey, or rugby.  Sports where falling on an outstretched arm occurs, such as basketball, skateboarding, or volleyball.  History of bone or joint disease.  History of shoulder injury. PREVENTION  Warm up before activity.  Stretch before activity.  Stay in shape with your:  Heart fitness.  Flexibility.  Shoulder Strength.  Falling with the proper technique. PROGNOSIS  In adults, healing time is about 7 weeks. For children, healing time is about 5 weeks. Surgery may be needed. RELATED COMPLICATIONS  The bones do not heal together (nonunion).  The bones do not  align properly when they heal (malunion).  Long-term problems with pain, stiffness, swelling, or loss of motion.  The injured arm heals shorter than the other.  Nerves are injured in the arm.  Arthritis in the shoulder.  Normal bone growth is interrupted in children.  Blood supply to the shoulder joint is diminished. TREATMENT If the bones are aligned, then initial treatment will be with ice and medicine to help with pain. The shoulder will be held in place with a sling (immobilization). The shoulder will be allowed to heal for up to 6 weeks. Injuries that may need surgery include:  Severe fractures.  Fractures that are not in appropriate alignment (displaced).  Non-displaced fractures (not common). Surgery helps the bones align correctly. The bones may be held in place with:  Sutures.  Wires.  Rods.  Plates.  Screws.  Pins. If you have had surgery or not, you will likely be assisted by a physical therapist or athletic trainer to get the best results with your injured shoulder. This will likely include exercises to strengthen and stretch the injured and surrounding areas. MEDICATION  If pain medicine is needed, nonsteroidal anti-inflammatory medicines (such as aspirin or ibuprofen) or other minor pain relievers (such as acetaminophen) are often advised.  Do not take pain medicine for 7 days before surgery.  Stronger pain relievers may be prescribed. Use only as directed and take only as much as you need. COLD THERAPY Cold treatment (icing) relieves pain and reduces inflammation. Cold treatment should be applied for 10 to 15 minutes every 2 to 3 hours,  and immediately after activity that aggravates your symptoms. Use ice packs or an ice massage. SEEK IMMEDIATE MEDICAL CARE IF:  You have severe shoulder pain unrelieved by rest and taking pain medicine.  You have pain, numbness, tingling, or weakness in the hand or wrist.  You have shortness of breath, chest pain,  severe weakness, or fainting.  You have severe pain with motion of the fingers or wrist.  Blue, gray, or dark color appears in the fingernails on injured extremity.   This information is not intended to replace advice given to you by your health care provider. Make sure you discuss any questions you have with your health care provider.   Document Released: 10/16/2005 Document Revised: 01/08/2012 Document Reviewed: 01/28/2009 Elsevier Interactive Patient Education [employer].

## 2015-09-28 NOTE — ED Notes (Signed)
States she was taking dog out at 0200 this am and was putting on shoes and fell against door frame into left shoulder. C/o left shoulder pain has trouble moving left arm. No bruising or other injury.

## 2015-09-29 DIAGNOSIS — S42292A Other displaced fracture of upper end of left humerus, initial encounter for closed fracture: Secondary | ICD-10-CM | POA: Diagnosis not present

## 2015-09-29 DIAGNOSIS — Z01818 Encounter for other preprocedural examination: Secondary | ICD-10-CM | POA: Diagnosis not present

## 2015-09-29 DIAGNOSIS — I10 Essential (primary) hypertension: Secondary | ICD-10-CM | POA: Diagnosis not present

## 2015-09-29 DIAGNOSIS — S42272A Torus fracture of upper end of left humerus, initial encounter for closed fracture: Secondary | ICD-10-CM | POA: Diagnosis not present

## 2015-09-29 DIAGNOSIS — F419 Anxiety disorder, unspecified: Secondary | ICD-10-CM | POA: Diagnosis not present

## 2015-09-29 DIAGNOSIS — E7800 Pure hypercholesterolemia, unspecified: Secondary | ICD-10-CM | POA: Diagnosis not present

## 2015-09-29 DIAGNOSIS — K5909 Other constipation: Secondary | ICD-10-CM | POA: Diagnosis not present

## 2015-09-29 DIAGNOSIS — I447 Left bundle-branch block, unspecified: Secondary | ICD-10-CM | POA: Diagnosis not present

## 2015-09-30 DIAGNOSIS — I10 Essential (primary) hypertension: Secondary | ICD-10-CM | POA: Diagnosis not present

## 2015-09-30 DIAGNOSIS — E785 Hyperlipidemia, unspecified: Secondary | ICD-10-CM | POA: Diagnosis not present

## 2015-09-30 DIAGNOSIS — H401131 Primary open-angle glaucoma, bilateral, mild stage: Secondary | ICD-10-CM | POA: Diagnosis not present

## 2015-09-30 DIAGNOSIS — Z01810 Encounter for preprocedural cardiovascular examination: Secondary | ICD-10-CM | POA: Diagnosis not present

## 2015-09-30 DIAGNOSIS — I447 Left bundle-branch block, unspecified: Secondary | ICD-10-CM | POA: Diagnosis not present

## 2015-09-30 DIAGNOSIS — R9431 Abnormal electrocardiogram [ECG] [EKG]: Secondary | ICD-10-CM | POA: Diagnosis not present

## 2015-10-04 ENCOUNTER — Emergency Department (HOSPITAL_COMMUNITY): Payer: Medicare Other

## 2015-10-04 ENCOUNTER — Inpatient Hospital Stay (HOSPITAL_COMMUNITY)
Admission: EM | Admit: 2015-10-04 | Discharge: 2015-10-09 | DRG: 876 | Disposition: A | Discharge status: Skilled Nursing Facility | Payer: Medicare Other | Attending: Internal Medicine | Admitting: Internal Medicine

## 2015-10-04 ENCOUNTER — Encounter (HOSPITAL_COMMUNITY): Payer: Self-pay | Admitting: *Deleted

## 2015-10-04 DIAGNOSIS — Z87440 Personal history of urinary (tract) infections: Secondary | ICD-10-CM | POA: Diagnosis not present

## 2015-10-04 DIAGNOSIS — Z9181 History of falling: Secondary | ICD-10-CM | POA: Diagnosis not present

## 2015-10-04 DIAGNOSIS — Z79899 Other long term (current) drug therapy: Secondary | ICD-10-CM | POA: Diagnosis not present

## 2015-10-04 DIAGNOSIS — B9620 Unspecified Escherichia coli [E. coli] as the cause of diseases classified elsewhere: Secondary | ICD-10-CM | POA: Diagnosis present

## 2015-10-04 DIAGNOSIS — Z888 Allergy status to other drugs, medicaments and biological substances status: Secondary | ICD-10-CM

## 2015-10-04 DIAGNOSIS — Z880 Allergy status to penicillin: Secondary | ICD-10-CM

## 2015-10-04 DIAGNOSIS — R259 Unspecified abnormal involuntary movements: Secondary | ICD-10-CM | POA: Diagnosis not present

## 2015-10-04 DIAGNOSIS — S4292XA Fracture of left shoulder girdle, part unspecified, initial encounter for closed fracture: Secondary | ICD-10-CM

## 2015-10-04 DIAGNOSIS — R42 Dizziness and giddiness: Secondary | ICD-10-CM | POA: Diagnosis not present

## 2015-10-04 DIAGNOSIS — Z96612 Presence of left artificial shoulder joint: Secondary | ICD-10-CM | POA: Diagnosis not present

## 2015-10-04 DIAGNOSIS — E785 Hyperlipidemia, unspecified: Secondary | ICD-10-CM | POA: Diagnosis present

## 2015-10-04 DIAGNOSIS — Z7982 Long term (current) use of aspirin: Secondary | ICD-10-CM

## 2015-10-04 DIAGNOSIS — Z01818 Encounter for other preprocedural examination: Secondary | ICD-10-CM | POA: Diagnosis not present

## 2015-10-04 DIAGNOSIS — F4323 Adjustment disorder with mixed anxiety and depressed mood: Principal | ICD-10-CM | POA: Diagnosis present

## 2015-10-04 DIAGNOSIS — Z96619 Presence of unspecified artificial shoulder joint: Secondary | ICD-10-CM

## 2015-10-04 DIAGNOSIS — Z887 Allergy status to serum and vaccine status: Secondary | ICD-10-CM | POA: Diagnosis not present

## 2015-10-04 DIAGNOSIS — R202 Paresthesia of skin: Secondary | ICD-10-CM | POA: Diagnosis present

## 2015-10-04 DIAGNOSIS — Z6822 Body mass index (BMI) 22.0-22.9, adult: Secondary | ICD-10-CM

## 2015-10-04 DIAGNOSIS — N390 Urinary tract infection, site not specified: Secondary | ICD-10-CM | POA: Diagnosis present

## 2015-10-04 DIAGNOSIS — Z471 Aftercare following joint replacement surgery: Secondary | ICD-10-CM | POA: Diagnosis not present

## 2015-10-04 DIAGNOSIS — E876 Hypokalemia: Secondary | ICD-10-CM | POA: Diagnosis present

## 2015-10-04 DIAGNOSIS — W1830XA Fall on same level, unspecified, initial encounter: Secondary | ICD-10-CM | POA: Diagnosis present

## 2015-10-04 DIAGNOSIS — I10 Essential (primary) hypertension: Secondary | ICD-10-CM | POA: Diagnosis present

## 2015-10-04 DIAGNOSIS — K5909 Other constipation: Secondary | ICD-10-CM | POA: Diagnosis present

## 2015-10-04 DIAGNOSIS — R296 Repeated falls: Secondary | ICD-10-CM | POA: Diagnosis present

## 2015-10-04 DIAGNOSIS — Z8249 Family history of ischemic heart disease and other diseases of the circulatory system: Secondary | ICD-10-CM | POA: Diagnosis not present

## 2015-10-04 DIAGNOSIS — S42202A Unspecified fracture of upper end of left humerus, initial encounter for closed fracture: Secondary | ICD-10-CM | POA: Diagnosis present

## 2015-10-04 DIAGNOSIS — F418 Other specified anxiety disorders: Secondary | ICD-10-CM | POA: Diagnosis not present

## 2015-10-04 DIAGNOSIS — R9431 Abnormal electrocardiogram [ECG] [EKG]: Secondary | ICD-10-CM

## 2015-10-04 DIAGNOSIS — G8918 Other acute postprocedural pain: Secondary | ICD-10-CM | POA: Diagnosis not present

## 2015-10-04 DIAGNOSIS — E43 Unspecified severe protein-calorie malnutrition: Secondary | ICD-10-CM | POA: Diagnosis not present

## 2015-10-04 DIAGNOSIS — K5900 Constipation, unspecified: Secondary | ICD-10-CM | POA: Diagnosis not present

## 2015-10-04 DIAGNOSIS — S42292A Other displaced fracture of upper end of left humerus, initial encounter for closed fracture: Secondary | ICD-10-CM | POA: Diagnosis not present

## 2015-10-04 DIAGNOSIS — S42202D Unspecified fracture of upper end of left humerus, subsequent encounter for fracture with routine healing: Secondary | ICD-10-CM | POA: Diagnosis not present

## 2015-10-04 DIAGNOSIS — F411 Generalized anxiety disorder: Secondary | ICD-10-CM | POA: Diagnosis not present

## 2015-10-04 LAB — BASIC METABOLIC PANEL
Anion gap: 11 (ref 5–15)
Anion gap: 9 (ref 5–15)
BUN: 8 mg/dL (ref 6–20)
BUN: 7 mg/dL (ref 6–20)
CALCIUM: 8.8 mg/dL — AB (ref 8.9–10.3)
CO2: 32 mmol/L (ref 22–32)
CO2: 32 mmol/L (ref 22–32)
CREATININE: 0.54 mg/dL (ref 0.44–1.00)
CREATININE: 0.65 mg/dL (ref 0.44–1.00)
Calcium: 9.1 mg/dL (ref 8.9–10.3)
Chloride: 95 mmol/L — ABNORMAL LOW (ref 101–111)
Chloride: 96 mmol/L — ABNORMAL LOW (ref 101–111)
GFR calc Af Amer: >60 mL/min (ref >60)
GFR calc Af Amer: >60 mL/min (ref >60)
GFR calc non Af Amer: >60 mL/min (ref >60)
GLUCOSE: 114 mg/dL — AB (ref 65–99)
Glucose, Bld: 127 mg/dL — ABNORMAL HIGH (ref 65–99)
POTASSIUM: 3.1 mmol/L — AB (ref 3.5–5.1)
Potassium: 3.2 mmol/L — ABNORMAL LOW (ref 3.5–5.1)
SODIUM: 138 mmol/L (ref 135–145)
Sodium: 137 mmol/L (ref 135–145)

## 2015-10-04 LAB — CBC WITH DIFFERENTIAL/PLATELET
BASOS ABS: 0 10*3/uL (ref 0.0–0.1)
Basophils Relative: 0 %
EOS ABS: 0 10*3/uL (ref 0.0–0.7)
EOS PCT: 0 %
HCT: 38.2 % (ref 36.0–46.0)
Hemoglobin: 12.8 g/dL (ref 12.0–15.0)
LYMPHS ABS: 0.8 10*3/uL (ref 0.7–4.0)
LYMPHS PCT: 10 %
MCH: 28.4 pg (ref 26.0–34.0)
MCHC: 33.5 g/dL (ref 30.0–36.0)
MCV: 84.9 fL (ref 78.0–100.0)
MONO ABS: 0.7 10*3/uL (ref 0.1–1.0)
Monocytes Relative: 10 %
Neutro Abs: 6.1 10*3/uL (ref 1.7–7.7)
Neutrophils Relative %: 80 %
PLATELETS: 293 10*3/uL (ref 150–400)
RBC: 4.5 MIL/uL (ref 3.87–5.11)
RDW: 14.4 % (ref 11.5–15.5)
WBC: 7.7 10*3/uL (ref 4.0–10.5)

## 2015-10-04 LAB — URINALYSIS, ROUTINE W REFLEX MICROSCOPIC
BILIRUBIN URINE: NEGATIVE
GLUCOSE, UA: NEGATIVE mg/dL
KETONES UR: NEGATIVE mg/dL
Nitrite: POSITIVE — AB
PROTEIN: 30 mg/dL — AB
Specific Gravity, Urine: 1.01 (ref 1.005–1.030)
pH: 7 (ref 5.0–8.0)

## 2015-10-04 LAB — MAGNESIUM: Magnesium: 1.9 mg/dL (ref 1.7–2.4)

## 2015-10-04 LAB — URINE MICROSCOPIC-ADD ON

## 2015-10-04 LAB — PHOSPHORUS: PHOSPHORUS: 2.6 mg/dL (ref 2.5–4.6)

## 2015-10-04 MED ORDER — AMLODIPINE BESYLATE 5 MG PO TABS
5.0000 mg | ORAL_TABLET | Freq: Every day | ORAL | Status: DC
  Administered 2015-10-05 – 2015-10-09 (×5): 5 mg via ORAL
  Filled 2015-10-04 (×5): qty 1

## 2015-10-04 MED ORDER — HYDRALAZINE HCL 10 MG PO TABS
10.0000 mg | ORAL_TABLET | Freq: Four times a day (QID) | ORAL | Status: DC
  Administered 2015-10-04 – 2015-10-09 (×18): 10 mg via ORAL
  Filled 2015-10-04 (×21): qty 1

## 2015-10-04 MED ORDER — AMLODIPINE BESY-BENAZEPRIL HCL 5-10 MG PO CAPS: 1.0000 | ORAL_CAPSULE | Freq: Every day | ORAL | Status: DC

## 2015-10-04 MED ORDER — PRAVASTATIN SODIUM 20 MG PO TABS
20.0000 mg | ORAL_TABLET | Freq: Every day | ORAL | Status: DC
  Administered 2015-10-04 – 2015-10-09 (×5): 20 mg via ORAL
  Filled 2015-10-04 (×5): qty 1

## 2015-10-04 MED ORDER — TRAMADOL HCL 50 MG PO TABS
50.0000 mg | ORAL_TABLET | Freq: Four times a day (QID) | ORAL | Status: DC | PRN
  Administered 2015-10-04 – 2015-10-08 (×8): 50 mg via ORAL
  Filled 2015-10-04 (×8): qty 1

## 2015-10-04 MED ORDER — BENAZEPRIL HCL 10 MG PO TABS
10.0000 mg | ORAL_TABLET | Freq: Every day | ORAL | Status: DC
  Filled 2015-10-04: qty 1

## 2015-10-04 MED ORDER — CHLORHEXIDINE GLUCONATE 0.12 % MT SOLN
15.0000 mL | Freq: Two times a day (BID) | OROMUCOSAL | Status: DC
  Administered 2015-10-06 – 2015-10-09 (×4): 15 mL via OROMUCOSAL
  Filled 2015-10-04 (×9): qty 15

## 2015-10-04 MED ORDER — SODIUM CHLORIDE 0.9 % IV BOLUS (SEPSIS)
500.0000 mL | Freq: Once | INTRAVENOUS | Status: AC
  Administered 2015-10-04: 500 mL via INTRAVENOUS

## 2015-10-04 MED ORDER — POTASSIUM CHLORIDE CRYS ER 20 MEQ PO TBCR
40.0000 meq | EXTENDED_RELEASE_TABLET | Freq: Once | ORAL | Status: AC
  Administered 2015-10-04: 40 meq via ORAL
  Filled 2015-10-04: qty 2

## 2015-10-04 MED ORDER — DOCUSATE SODIUM 100 MG PO CAPS
100.0000 mg | ORAL_CAPSULE | Freq: Two times a day (BID) | ORAL | Status: DC
  Administered 2015-10-04 – 2015-10-06 (×6): 100 mg via ORAL
  Filled 2015-10-04 (×7): qty 1

## 2015-10-04 MED ORDER — LATANOPROST 0.005 % OP SOLN
1.0000 [drp] | Freq: Every day | OPHTHALMIC | Status: DC
  Administered 2015-10-04 – 2015-10-09 (×7): 1 [drp] via OPHTHALMIC
  Filled 2015-10-04 (×2): qty 2.5

## 2015-10-04 MED ORDER — HYDRALAZINE HCL 20 MG/ML IJ SOLN
5.0000 mg | Freq: Four times a day (QID) | INTRAMUSCULAR | Status: DC | PRN
  Administered 2015-10-04: 5 mg via INTRAVENOUS
  Filled 2015-10-04: qty 1

## 2015-10-04 MED ORDER — ONDANSETRON HCL 4 MG PO TABS: 4.0000 mg | ORAL_TABLET | Freq: Four times a day (QID) | ORAL | Status: DC | PRN

## 2015-10-04 MED ORDER — ASPIRIN 81 MG PO CHEW
81.0000 mg | CHEWABLE_TABLET | Freq: Every day | ORAL | Status: DC
  Administered 2015-10-08 – 2015-10-09 (×2): 81 mg via ORAL
  Filled 2015-10-04 (×5): qty 1

## 2015-10-04 MED ORDER — POLYVINYL ALCOHOL 1.4 % OP SOLN
1.0000 [drp] | Freq: Two times a day (BID) | OPHTHALMIC | Status: DC
  Administered 2015-10-04 – 2015-10-09 (×10): 1 [drp] via OPHTHALMIC
  Filled 2015-10-04 (×2): qty 15

## 2015-10-04 MED ORDER — POLYETHYLENE GLYCOL 3350 17 G PO PACK
17.0000 g | PACK | Freq: Every day | ORAL | Status: DC
  Administered 2015-10-04 – 2015-10-09 (×5): 17 g via ORAL
  Filled 2015-10-04 (×5): qty 1

## 2015-10-04 MED ORDER — BENAZEPRIL HCL 20 MG PO TABS
10.0000 mg | ORAL_TABLET | Freq: Every day | ORAL | Status: DC
  Administered 2015-10-05 – 2015-10-09 (×5): 10 mg via ORAL
  Filled 2015-10-04 (×5): qty 1

## 2015-10-04 MED ORDER — ACETAMINOPHEN 500 MG PO TABS
500.0000 mg | ORAL_TABLET | Freq: Four times a day (QID) | ORAL | Status: DC | PRN
  Administered 2015-10-05: 500 mg via ORAL
  Filled 2015-10-04: qty 1

## 2015-10-04 MED ORDER — ALPRAZOLAM 0.5 MG PO TABS
0.5000 mg | ORAL_TABLET | Freq: Three times a day (TID) | ORAL | Status: DC | PRN
  Administered 2015-10-04 – 2015-10-09 (×8): 0.5 mg via ORAL
  Filled 2015-10-04 (×8): qty 1

## 2015-10-04 MED ORDER — AMLODIPINE BESYLATE 5 MG PO TABS
5.0000 mg | ORAL_TABLET | Freq: Every day | ORAL | Status: DC
  Filled 2015-10-04: qty 1

## 2015-10-04 MED ORDER — DEXTROSE 5 % IV SOLN
1.0000 g | Freq: Once | INTRAVENOUS | Status: AC
  Administered 2015-10-04: 1 g via INTRAVENOUS
  Filled 2015-10-04: qty 10

## 2015-10-04 MED ORDER — ONDANSETRON HCL 4 MG/2ML IJ SOLN: 4.0000 mg | Freq: Four times a day (QID) | INTRAMUSCULAR | Status: DC | PRN

## 2015-10-04 MED ORDER — CIPROFLOXACIN IN D5W 400 MG/200ML IV SOLN
400.0000 mg | Freq: Two times a day (BID) | INTRAVENOUS | Status: DC
  Administered 2015-10-04 – 2015-10-05 (×3): 400 mg via INTRAVENOUS
  Filled 2015-10-04 (×3): qty 200

## 2015-10-04 NOTE — H&P (Addendum)
[facility] History and Physical  Harsimran Kanitz [id number] DOB: 1932/02/04 DOA: 10/04/2015  Referring physician: ER PA: Waynetta Pean PCP: Dr. Veverly Fells of orthopedic surgery   Chief Complaint: dizziness  HPI:  81 year old female with past medical history of hypertension, dyslipidemia, recent fall on 09/28/2015 at which time she sustained a left shoulder fracture and she is due for surgery this coming Friday, 10/08/2015. Patient presented with reports of dizziness which started earlier today prior to this admission. She reports having chronic dizziness but this time she had associated weakness and was unable to ambulate even short distances. Patient tried to walk from one chair to another and she stumbled onto the chair. She reports previous episodes similar to this but no associated weakness at that time. Patient reports no reports of lightheadedness. No reports of loss of consciousness. No reports of chest pain, shortness of breath or palpitations. No fevers or chills. No abdominal pain, nausea or vomiting. No blood in the stool or urine.  In ED, patient was hemodynamically stable. Blood work was relatively unremarkable except for potassium of 3.1 which was supplemented. MRI of the brain did not reveal acute intracranial findings. Urinalysis however showed large leukocytes, positive nitrites and many bacteria. She was started on empiric Cipro while awaiting urine culture results.  Assessment & Plan    Principal Problem:   Dizziness - Unclear etiology, possible vertigo - Obtain physical therapy evaluation, vestibular therapy - MRI brain did not show acute intracranial findings - Will give short course of fluids for hydration  Active Problems:   UTI (lower urinary tract infection) - Urinalysis showed large leukocytes and many bacteria - Patient started on empiric Cipro - Follow-up urine culture results    Benign essential HTN - Resume home medication, Lotrel - SBP in 170's  so we added hydralazine 10 mg every 6 hours for better blood pressure control    Hypokalemia - Likely due to Lotrel - Supplemented    Dyslipidemia - Continue Pravachol  DVT prophylaxis:  - SCD's bilaterally   Radiological Exams on Admission: Mr Herby Abraham Contrast 10/04/2015   Exam is motion degraded. No acute infarct. Prominent small vessel disease type changes. Global atrophy without hydrocephalus. Thickening anterior falx may be related to ossification rather than meningioma or hemorrhage.   Code Status: Full Family Communication: Plan of care discussed with the patient  Disposition Plan: Admit for further evaluation, medical floor   Leisa Lenz, MD  [facility] Pager [phone]  Time spent in minutes: 55 minutes  Review of Systems:  Constitutional: Negative for fever, chills and malaise/fatigue. Negative for diaphoresis.  HENT: Negative for hearing loss, ear pain, nosebleeds, congestion, sore throat, neck pain, tinnitus and ear discharge.   Eyes: Negative for blurred vision, double vision, photophobia, pain, discharge and redness.  Respiratory: Negative for cough, hemoptysis, sputum production, shortness of breath, wheezing and stridor.   Cardiovascular: Negative for chest pain, palpitations, orthopnea, claudication and leg swelling.  Gastrointestinal: Negative for nausea, vomiting and abdominal pain. Negative for heartburn, constipation, blood in stool and melena.  Genitourinary: Negative for dysuria, urgency, frequency, hematuria and flank pain.  Musculoskeletal: Negative for myalgias, back pain, joint pain and falls.  Skin: Negative for itching and rash.  Neurological: positive for dizziness and weakness. Negative for tingling, tremors, sensory change, speech change, focal weakness, loss of consciousness and headaches.  Endo/Heme/Allergies: Negative for environmental allergies and polydipsia. Does not bruise/bleed easily.  Psychiatric/Behavioral: Negative for suicidal  ideas. The patient is not nervous/anxious.  Past Medical History  Diagnosis Date  . Dizziness    History reviewed. No pertinent past surgical history. Social History:  reports that she has never smoked. She does not have any smokeless tobacco history on file. She reports that she does not drink alcohol. Her drug history is not on file.  Allergies  Allergen Reactions  . Tetanus Toxoids Anaphylaxis and Hives  . Penicillins Hives    Has patient had a PCN reaction causing immediate rash, facial/tongue/throat swelling, SOB or lightheadedness with hypotension: no Has patient had a PCN reaction causing severe rash involving mucus membranes or skin necrosis: no Has patient had a PCN reaction that required hospitalization no Has patient had a PCN reaction occurring within the last 10 years: no If all of the above answers are "NO", then may proceed with Cephalosporin use.  Marland Kitchen Phenobarbital Other (See Comments)    Reaction unknown    Family History: hypertension in mother    Prior to Admission medications   Medication Sig Start Date End Date Taking? Authorizing Provider  acetaminophen (TYLENOL) 500 MG tablet Take 500 mg by mouth every 6 (six) hours as needed (For pain.).   Yes Historical Provider, MD  amLODipine-benazepril (LOTREL) 5-10 MG capsule Take 1 capsule by mouth daily. 09/30/15  Yes Historical Provider, MD  aspirin 81 MG tablet Take 81 mg by mouth daily.   Yes Historical Provider, MD  clorazepate (TRANXENE) 7.5 MG tablet Take 7.5 mg by mouth 2 (two) times daily as needed for anxiety or sleep.    Yes Historical Provider, MD  docusate sodium (COLACE) 100 MG capsule Take 1 capsule (100 mg total) by mouth every 12 (twelve) hours. 09/28/15  Yes Davonna Belling, MD  LUMIGAN 0.01 % SOLN Place 1 drop into both eyes daily at 2 PM. 09/30/15  Yes Historical Provider, MD  polyethylene glycol (MIRALAX / GLYCOLAX) packet Take 17 g by mouth daily. 09/28/15  Yes Davonna Belling, MD  Polyvinyl  Alcohol-Povidone (REFRESH OP) Place 1 drop into both eyes 3 (three) times daily.   Yes Historical Provider, MD  pravastatin (PRAVACHOL) 20 MG tablet Take 20 mg by mouth daily.   Yes Historical Provider, MD  traMADol (ULTRAM) 50 MG tablet Take 1 tablet (50 mg total) by mouth every 6 (six) hours as needed. Patient taking differently: Take 50 mg by mouth every 6 (six) hours as needed (For pain.).  09/28/15  Yes Davonna Belling, MD   Physical Exam: Filed Vitals:   10/04/15 0936 10/04/15 1000 10/04/15 1030 10/04/15 1131  BP:  177/155 147/103 169/81  Pulse:   100 106  Temp: 98.6 F (37 C)     TempSrc:      Resp:  21 30   SpO2:   94% 92%    Physical Exam  Constitutional: Appears well-developed and well-nourished. No distress.  HENT: Normocephalic. No tonsillar erythema or exudates Eyes: Conjunctivae  are normal. PERRLA, no scleral icterus.  Neck: Normal ROM. Neck supple. No JVD. No tracheal deviation. No thyromegaly.  CVS: irregular rhythm, S1/S2 appreciated.  Pulmonary: Effort and breath sounds normal, no stridor, rhonchi, wheezes, rales.  Abdominal: Soft. BS +,  no distension, tenderness, rebound or guarding.  Musculoskeletal: Normal range of motion. No edema and no tenderness.  Lymphadenopathy: No lymphadenopathy noted, cervical, inguinal. Neuro: Alert. Normal reflexes, muscle tone coordination. No focal neurologic deficits. Skin: Skin is warm and dry.  Psychiatric: Normal mood and affect. Behavior, judgment, thought content normal.   Labs on Admission:  Basic Metabolic Panel:  Recent  Labs Lab 10/04/15 0747  NA 138  K 3.1*  CL 95*  CO2 32  GLUCOSE 114*  BUN 8  CREATININE 0.54  CALCIUM 9.1   Liver Function Tests: No results for input(s): AST, ALT, ALKPHOS, BILITOT, PROT, ALBUMIN in the last 168 hours. No results for input(s): LIPASE, AMYLASE in the last 168 hours. No results for input(s): AMMONIA in the last 168 hours. CBC:  Recent Labs Lab 10/04/15 0747  WBC 7.7   NEUTROABS 6.1  HGB 12.8  HCT 38.2  MCV 84.9  PLT 293   Cardiac Enzymes: No results for input(s): CKTOTAL, CKMB, CKMBINDEX, TROPONINI in the last 168 hours. BNP: Invalid input(s): POCBNP CBG: No results for input(s): GLUCAP in the last 168 hours.  If 7PM-7AM, please contact night-coverage [email or web address] Password [email or web address] 10/04/2015, 11:54 AM

## 2015-10-04 NOTE — ED Notes (Addendum)
Pt reports increased dizziness since arm injury and several recent falls.  Sts "it gets worse when I turn my head."    Pt's daughter reports that the Pt is "obsessed" w/ having bowel movements.  Sts "she is terrified that she is going to get a blockage.  Before she hurt her arm, she was constantly taking laxatives and giving herself enemas.  Now that she has one arm, she will put Vaseline on her finger and put it up there to make herself go."     When asked about Home Health, Pt's daughter reports that the Pt "feels like she is independent and will not consider it."  She further reports that the Pt recently attempted to take her off her contact list at the PCP's office.  Daughter is POA and has paperwork at the bedside.

## 2015-10-04 NOTE — ED Notes (Signed)
Hospitalist at bedside 

## 2015-10-04 NOTE — ED Provider Notes (Signed)
CSN: [id number]     Arrival date & time 10/04/15  0631 History   First MD Initiated Contact with Patient 10/04/15 [phone]     Chief Complaint  Patient presents with  . Dizziness  . Hypertension   Eileen Reeves is a 81 y.o. female with a history of hypertension, anxiety and dizziness who presents to the emergency department complaining of dizziness this morning. The patient had a recent fall on 09/28/15 and broke her left shoulder. She is due for surgery this Friday by Dr. Veverly Fells. She reports she has been taking her pain medications. She reports she woke up to use the bathroom this morning and felt like she was spinning. She reports she fell back onto her bed. She denies hitting her head or LOC. She reported feeling very unsteady on her feet. She reports some difficulty at home due to her left arm in a sling. She complains of numbness in her bilateral arms for several months. She denies fevers, recent illness, headache, changes to her vision, neck pain, abdominal pain, nausea, vomiting, diarrhea, chest pain, SOB, urinary symptoms, hematuria, changes to her urine output, syncope, tingling or weakness.  (Consider location/radiation/quality/duration/timing/severity/associated sxs/prior Treatment) HPI  Past Medical History  Diagnosis Date  . Dizziness    History reviewed. No pertinent past surgical history. No family history on file. Social History  Substance Use Topics  . Smoking status: Never Smoker   . Smokeless tobacco: None  . Alcohol Use: No   OB History    No data available     Review of Systems  Constitutional: Negative for fever and chills.  HENT: Negative for congestion and sore throat.   Eyes: Negative for visual disturbance.  Respiratory: Negative for cough, shortness of breath and wheezing.   Cardiovascular: Negative for chest pain and palpitations.  Gastrointestinal: Negative for nausea, vomiting, abdominal pain and diarrhea.  Genitourinary: Negative for dysuria and  difficulty urinating.  Musculoskeletal: Positive for arthralgias. Negative for back pain and neck pain.  Skin: Negative for rash.  Neurological: Positive for dizziness, light-headedness and numbness. Negative for tremors, seizures, syncope, speech difficulty, weakness and headaches.  Psychiatric/Behavioral: Negative for confusion.      Allergies  Tetanus toxoids; Penicillins; and Phenobarbital  Home Medications   Prior to Admission medications   Medication Sig Start Date End Date Taking? Authorizing Provider  acetaminophen (TYLENOL) 500 MG tablet Take 500 mg by mouth every 6 (six) hours as needed (For pain.).   Yes Historical Provider, MD  amLODipine-benazepril (LOTREL) 5-10 MG capsule Take 1 capsule by mouth daily. 09/30/15  Yes Historical Provider, MD  aspirin 81 MG tablet Take 81 mg by mouth daily.   Yes Historical Provider, MD  clorazepate (TRANXENE) 7.5 MG tablet Take 7.5 mg by mouth 2 (two) times daily as needed for anxiety or sleep.    Yes Historical Provider, MD  docusate sodium (COLACE) 100 MG capsule Take 1 capsule (100 mg total) by mouth every 12 (twelve) hours. 09/28/15  Yes Davonna Belling, MD  LUMIGAN 0.01 % SOLN Place 1 drop into both eyes daily at 2 PM. 09/30/15  Yes Historical Provider, MD  polyethylene glycol (MIRALAX / GLYCOLAX) packet Take 17 g by mouth daily. 09/28/15  Yes Davonna Belling, MD  Polyvinyl Alcohol-Povidone (REFRESH OP) Place 1 drop into both eyes 3 (three) times daily.   Yes Historical Provider, MD  pravastatin (PRAVACHOL) 20 MG tablet Take 20 mg by mouth daily.   Yes Historical Provider, MD  traMADol (ULTRAM) 50 MG tablet Take 1  tablet (50 mg total) by mouth every 6 (six) hours as needed. Patient taking differently: Take 50 mg by mouth every 6 (six) hours as needed (For pain.).  09/28/15  Yes Davonna Belling, MD   BP 166/61 mmHg  Pulse 98  Temp(Src) 98.8 F (37.1 C) (Oral)  Resp 20  Ht 5\' 5"  (1.651 m)  Wt 57.3 kg  BMI 21.02 kg/m2  SpO2  95% Physical Exam  Constitutional: She is oriented to person, place, and time. She appears well-developed and well-nourished. No distress.  Nontoxic appearing.  HENT:  Head: Normocephalic and atraumatic.  Mouth/Throat: Oropharynx is clear and moist. No oropharyngeal exudate.  Eyes: Conjunctivae and EOM are normal. Pupils are equal, round, and reactive to light. Right eye exhibits no discharge. Left eye exhibits no discharge.  Neck: Normal range of motion. Neck supple. No JVD present. No tracheal deviation present.  Cardiovascular: Normal rate, regular rhythm, normal heart sounds and intact distal pulses.  Exam reveals no gallop and no friction rub.   No murmur heard. Bilateral radial pulses are intact. Good capillary refill of her left distal fingertips.   Pulmonary/Chest: Effort normal and breath sounds normal. No respiratory distress. She has no wheezes. She has no rales.  Abdominal: Soft. Bowel sounds are normal. She exhibits no distension. There is no tenderness. There is no guarding.  Musculoskeletal: She exhibits no edema or tenderness.  No lower extremity edema or tenderness. Patient has good strength in her bilateral lower extremities. Patient has good and equal grip strengths bilaterally.  Left arm compartments feel soft. Left arm in shoulder sling.   Lymphadenopathy:    She has no cervical adenopathy.  Neurological: She is alert and oriented to person, place, and time. No cranial nerve deficit. Coordination normal.  Patient is alert and oriented 3. Cranial nerves are intact. Sensation that her bilateral upper and lower extremities. Right finger to nose is intact. Good and equal grip strength bilaterally. EOMs intact.   Skin: Skin is warm and dry. No rash noted. She is not diaphoretic. No erythema. No pallor.  Psychiatric: She has a normal mood and affect. Her behavior is normal.  Nursing note and vitals reviewed.   ED Course  Procedures (including critical care time) Labs  Review Labs Reviewed  URINALYSIS, ROUTINE W REFLEX MICROSCOPIC (NOT AT [facility]) - Abnormal; Notable for the following:    APPearance CLOUDY (*)    Hgb urine dipstick MODERATE (*)    Protein, ur 30 (*)    Nitrite POSITIVE (*)    Leukocytes, UA LARGE (*)    All other components within normal limits  BASIC METABOLIC PANEL - Abnormal; Notable for the following:    Potassium 3.1 (*)    Chloride 95 (*)    Glucose, Bld 114 (*)    All other components within normal limits  URINE MICROSCOPIC-ADD ON - Abnormal; Notable for the following:    Squamous Epithelial / LPF 0-5 (*)    Bacteria, UA MANY (*)    All other components within normal limits  BASIC METABOLIC PANEL - Abnormal; Notable for the following:    Potassium 3.2 (*)    Chloride 96 (*)    Glucose, Bld 127 (*)    Calcium 8.8 (*)    All other components within normal limits  URINE CULTURE  CBC WITH DIFFERENTIAL/PLATELET  MAGNESIUM  PHOSPHORUS    Imaging Review Mr Brain Wo Contrast  10/04/2015  CLINICAL DATA:  81 year old female with dizziness since all arm injury. Several falls. Initial encounter.  EXAM: MRI HEAD WITHOUT CONTRAST TECHNIQUE: Multiplanar, multiecho pulse sequences of the brain and surrounding structures were obtained without intravenous contrast. COMPARISON:  None. FINDINGS: Exam is motion degraded. No acute infarct. Prominent small vessel disease type changes. Global atrophy without hydrocephalus. Thickening anterior falx may be related to ossification rather than meningioma or hemorrhage. Major intracranial vascular structures are patent. Post lens replacement. Partially empty sella felt to be an incidental finding. Cervical medullary junction and pineal region unremarkable. Mild spinal stenosis C3-4. IMPRESSION: Exam is motion degraded. No acute infarct. Prominent small vessel disease type changes. Global atrophy without hydrocephalus. Thickening anterior falx may be related to ossification rather than meningioma or  hemorrhage. Electronically Signed   By: Genia Del M.D.   On: 10/04/2015 11:37   I have personally reviewed and evaluated these images and lab results as part of my medical decision-making.   EKG Interpretation None      Filed Vitals:   10/04/15 1131 10/04/15 1201 10/04/15 1230 10/04/15 1258  BP: 169/81 132/93 136/75 166/61  Pulse: 106 95 100 98  Temp:    98.8 F (37.1 C)  TempSrc:    Oral  Resp:  26 18 20   Height:    5\' 5"  (1.651 m)  Weight:    57.3 kg  SpO2: 92% 90% 96% 95%     MDM   Meds given in ED:  Medications  acetaminophen (TYLENOL) tablet 500 mg (not administered)  latanoprost (XALATAN) 0.005 % ophthalmic solution 1 drop (not administered)  polyvinyl alcohol (LIQUIFILM TEARS) 1.4 % ophthalmic solution 1 drop (not administered)  docusate sodium (COLACE) capsule 100 mg (100 mg Oral Given 10/04/15 1351)  polyethylene glycol (MIRALAX / GLYCOLAX) packet 17 g (17 g Oral Given 10/04/15 1351)  traMADol (ULTRAM) tablet 50 mg (50 mg Oral Given 10/04/15 1351)  aspirin chewable tablet 81 mg (81 mg Oral Not Given 10/04/15 1400)  pravastatin (PRAVACHOL) tablet 20 mg (20 mg Oral Given 10/04/15 1351)  ondansetron (ZOFRAN) tablet 4 mg (not administered)    Or  ondansetron (ZOFRAN) injection 4 mg (not administered)  ciprofloxacin (CIPRO) IVPB 400 mg (400 mg Intravenous Transfusing/Transfer 10/04/15 1237)  hydrALAZINE (APRESOLINE) injection 5 mg (5 mg Intravenous Given 10/04/15 1211)  hydrALAZINE (APRESOLINE) tablet 10 mg (10 mg Oral Given 10/04/15 1404)  amLODipine (NORVASC) tablet 5 mg (not administered)    And  benazepril (LOTENSIN) tablet 10 mg (not administered)  ALPRAZolam (XANAX) tablet 0.5 mg (not administered)  sodium chloride 0.9 % bolus 500 mL (0 mLs Intravenous Stopped 10/04/15 0855)  cefTRIAXone (ROCEPHIN) 1 g in dextrose 5 % 50 mL IVPB (0 g Intravenous Stopped 10/04/15 0935)  potassium chloride SA (K-DUR,KLOR-CON) CR tablet 40 mEq (40 mEq Oral Given 10/04/15 1211)     Current Discharge Medication List      Final diagnoses:  UTI (lower urinary tract infection)  Dizziness   This  is a 81 y.o. female with a history of hypertension, anxiety and dizziness who presents to the emergency department complaining of dizziness this morning. The patient had a recent fall on 09/28/15 and broke her left shoulder. She is due for surgery this Friday by Dr. Veverly Fells. She reports she has been taking her pain medications. She reports she woke up to use the bathroom this morning and felt like she was spinning. She reports she fell back onto her bed. She denies hitting her head or LOC. She reported feeling very unsteady on her feet. She reports some difficulty at home due to her left  arm in a sling. On exam patient is afebrile nontoxic appearing. Her left arm compartments feel soft. Her abdomen is soft nontender palpation. She has no focal neurological deficits on my exam. Patient's BMP is remarkable for a potassium of 3.1 with coronary 95. CBC is within normal limits. Urinalysis shows nitrite positive urinary tract infection. Urine sent for culture. Rocephin IV started for her UTI. This patient is complaining of dizziness and spinning I feel we need to rule out a central cause for her vertigo. MRI was obtained which showed no acute infarct. Patient admitted by Dr. Charlies Silvers. The patient is in agreement with admission.  This patient was discussed with and evaluated by Dr. Alvino Chapel who agrees with assessment and plan.    Waynetta Pean, PA-C 10/04/15 [place], MD 10/05/15 [phone]

## 2015-10-04 NOTE — ED Notes (Signed)
Bed: [id number] Expected date: 10/04/15 Expected time: 6:09 AM Means of arrival: Ambulance Comments: 81 yo F  Dizziness, HTN

## 2015-10-04 NOTE — ED Notes (Signed)
Pt not able to sit without becoming dizzy.  Had to put back into bed because of dizziness.

## 2015-10-04 NOTE — ED Notes (Signed)
Per [facility] report: pt coming from home. Pt woke up this morning and felt dizziness.  Pt has been having an increasing number of falls recently.  Pt recently fractured left shoulder.  On [facility] arrival, pt's blood pressure was elevated and then pt took her amlodipine prior to coming to the ED.  Pt recently prescribed trazadone but denies taken any recently. Pt a/o 4.  Pt unsteady on her feet.

## 2015-10-05 DIAGNOSIS — F4323 Adjustment disorder with mixed anxiety and depressed mood: Secondary | ICD-10-CM | POA: Diagnosis present

## 2015-10-05 DIAGNOSIS — E43 Unspecified severe protein-calorie malnutrition: Secondary | ICD-10-CM | POA: Diagnosis present

## 2015-10-05 DIAGNOSIS — R202 Paresthesia of skin: Secondary | ICD-10-CM | POA: Diagnosis present

## 2015-10-05 DIAGNOSIS — S4292XA Fracture of left shoulder girdle, part unspecified, initial encounter for closed fracture: Secondary | ICD-10-CM | POA: Diagnosis present

## 2015-10-05 DIAGNOSIS — K5909 Other constipation: Secondary | ICD-10-CM | POA: Diagnosis present

## 2015-10-05 LAB — BASIC METABOLIC PANEL
ANION GAP: 10 (ref 5–15)
BUN: 10 mg/dL (ref 6–20)
CO2: 29 mmol/L (ref 22–32)
Calcium: 9.2 mg/dL (ref 8.9–10.3)
Chloride: 99 mmol/L — ABNORMAL LOW (ref 101–111)
Creatinine, Ser: 0.58 mg/dL (ref 0.44–1.00)
GFR calc Af Amer: >60 mL/min (ref >60)
Glucose, Bld: 125 mg/dL — ABNORMAL HIGH (ref 65–99)
POTASSIUM: 3.3 mmol/L — AB (ref 3.5–5.1)
SODIUM: 138 mmol/L (ref 135–145)

## 2015-10-05 LAB — TSH: TSH: 1.151 u[IU]/mL (ref 0.350–4.500)

## 2015-10-05 LAB — HEPATIC FUNCTION PANEL
ALBUMIN: 3.4 g/dL — AB (ref 3.5–5.0)
ALT: 11 U/L — ABNORMAL LOW (ref 14–54)
AST: 16 U/L (ref 15–41)
Alkaline Phosphatase: 48 U/L (ref 38–126)
Bilirubin, Direct: 0.2 mg/dL (ref 0.1–0.5)
Indirect Bilirubin: 0.7 mg/dL (ref 0.3–0.9)
TOTAL PROTEIN: 6.7 g/dL (ref 6.5–8.1)
Total Bilirubin: 0.9 mg/dL (ref 0.3–1.2)

## 2015-10-05 LAB — CBC
HCT: 39.4 % (ref 36.0–46.0)
Hemoglobin: 13 g/dL (ref 12.0–15.0)
MCH: 28.1 pg (ref 26.0–34.0)
MCHC: 33 g/dL (ref 30.0–36.0)
MCV: 85.3 fL (ref 78.0–100.0)
PLATELETS: 325 10*3/uL (ref 150–400)
RBC: 4.62 MIL/uL (ref 3.87–5.11)
RDW: 14.7 % (ref 11.5–15.5)
WBC: 9 10*3/uL (ref 4.0–10.5)

## 2015-10-05 LAB — GLUCOSE, CAPILLARY: GLUCOSE-CAPILLARY: 125 mg/dL — AB (ref 65–99)

## 2015-10-05 MED ORDER — POTASSIUM CHLORIDE CRYS ER 20 MEQ PO TBCR
20.0000 meq | EXTENDED_RELEASE_TABLET | Freq: Two times a day (BID) | ORAL | Status: DC
  Administered 2015-10-05 – 2015-10-09 (×9): 20 meq via ORAL
  Filled 2015-10-05 (×10): qty 1

## 2015-10-05 MED ORDER — VENLAFAXINE HCL ER 37.5 MG PO CP24
37.5000 mg | ORAL_CAPSULE | Freq: Every day | ORAL | Status: DC
  Administered 2015-10-06 – 2015-10-09 (×3): 37.5 mg via ORAL
  Filled 2015-10-05 (×6): qty 1

## 2015-10-05 MED ORDER — FLEET ENEMA 7-19 GM/118ML RE ENEM: 1.0000 | ENEMA | Freq: Every day | RECTAL | Status: DC | PRN

## 2015-10-05 MED ORDER — CEFAZOLIN SODIUM 1-5 GM-% IV SOLN
1.0000 g | Freq: Three times a day (TID) | INTRAVENOUS | Status: DC
  Administered 2015-10-05 – 2015-10-08 (×8): 1 g via INTRAVENOUS
  Filled 2015-10-05 (×13): qty 50

## 2015-10-05 MED ORDER — HYDROXYZINE HCL 10 MG PO TABS
10.0000 mg | ORAL_TABLET | Freq: Three times a day (TID) | ORAL | Status: DC
  Administered 2015-10-05 – 2015-10-09 (×9): 10 mg via ORAL
  Filled 2015-10-05 (×16): qty 1

## 2015-10-05 MED ORDER — ENSURE ENLIVE PO LIQD
237.0000 mL | Freq: Two times a day (BID) | ORAL | Status: DC
  Administered 2015-10-06 – 2015-10-09 (×4): 237 mL via ORAL

## 2015-10-05 NOTE — Consult Note (Signed)
[place] [facility] Consult   Reason for Consult:  Depression and anxiety Referring Physician:  Dr. Rockne Menghini Patient Identification: Eileen Reeves MRN:  [id number] Principal Diagnosis: Adjustment disorder with mixed anxiety and depressed mood Diagnosis:   Patient Active Problem List   Diagnosis Date Noted  . Adjustment disorder with mixed anxiety and depressed mood [F43.23] 10/05/2015  . Paresthesia of both hands rule out neuropathy [R20.2] 10/05/2015  . Chronic constipation [K59.00] 10/05/2015  . Dizziness [R42] 10/04/2015  . UTI (lower urinary tract infection) [N39.0] 10/04/2015  . Benign essential HTN [I10] 10/04/2015  . Hypokalemia [E87.6] 10/04/2015  . Dyslipidemia [E78.5] 10/04/2015    Total Time spent with patient: 45 minutes  Subjective:   Eileen Reeves is a 81 y.o. female patient admitted with depression and anxiety and dizziness.  HPI:  Eileen Reeves 81 year old female seen, chart reviewed and case discussed with patient daughter who is at bedside for face-to-face psychiatric and evaluation for depression and anxiety. Patient has been suffering with generalized anxiety over years and recently increased symptoms of depression and feels like her daughter is going to have bonded her and make her to lie down and die. Patient misunderstood when her daughter told her to lie down when she has been feeling dizziness and have frequent falls and called her daughter. Patient seems to be very guarded and does not let any one tell her what to do. She wanted to be independent because she has been independent throughout her life at the same time she is suffering with the increased symptoms of depression anxiety and also physical problems. Patient is having hard time to make a decision about receiving psychiatric services are not medication or not. Patient agrees with her daughter who wanted her to be on medication management for controlling her symptoms of depression and anxiety.  Patient will be transferred to the [facility] for surgery on Friday. Patient wanted to go to the [facility] tomorrow not today. Patient is willing to take medication that helped for anxiety and depression.  Past Psychiatric History: Depression and anxiety has no previous acute psychiatric hospitalization.  Risk to Self: Is patient at risk for suicide?: No Risk to Others:   Prior Inpatient Therapy:   Prior Outpatient Therapy:    Past Medical History:  Past Medical History  Diagnosis Date  . Dizziness    History reviewed. No pertinent past surgical history. Family History: No family history on file. Family Psychiatric  History: Patient daughter has been taking psychiatric medication management to control her bipolar depression. Social History:  History  Alcohol Use No     History  Drug Use Not on file    Social History   Social History  . Marital Status: Unknown    Spouse Name: N/A  . Number of Children: N/A  . Years of Education: N/A   Social History Main Topics  . Smoking status: Never Smoker   . Smokeless tobacco: None  . Alcohol Use: No  . Drug Use: None  . Sexual Activity: Not Asked   Other Topics Concern  . None   Social History Narrative   Additional Social History:                          Allergies:   Allergies  Allergen Reactions  . Tetanus Toxoids Anaphylaxis and Hives  . Penicillins Hives    Has patient had a PCN reaction causing immediate rash, facial/tongue/throat swelling, SOB or lightheadedness with hypotension: no Has  patient had a PCN reaction causing severe rash involving mucus membranes or skin necrosis: no Has patient had a PCN reaction that required hospitalization no Has patient had a PCN reaction occurring within the last 10 years: no If all of the above answers are "NO", then may proceed with Cephalosporin use.  Marland Kitchen Phenobarbital Other (See Comments)    Reaction unknown    Labs:  Results for orders placed or performed  during the hospital encounter of 10/04/15 (from the past 48 hour(s))  Urinalysis, Routine w reflex microscopic (not at [facility])     Status: Abnormal   Collection Time: 10/04/15  6:58 AM  Result Value Ref Range   Color, Urine YELLOW YELLOW   APPearance CLOUDY (A) CLEAR   Specific Gravity, Urine 1.010 1.005 - 1.030   pH 7.0 5.0 - 8.0   Glucose, UA NEGATIVE NEGATIVE mg/dL   Hgb urine dipstick MODERATE (A) NEGATIVE   Bilirubin Urine NEGATIVE NEGATIVE   Ketones, ur NEGATIVE NEGATIVE mg/dL   Protein, ur 30 (A) NEGATIVE mg/dL   Nitrite POSITIVE (A) NEGATIVE   Leukocytes, UA LARGE (A) NEGATIVE  Urine microscopic-add on     Status: Abnormal   Collection Time: 10/04/15  6:58 AM  Result Value Ref Range   Squamous Epithelial / LPF 0-5 (A) NONE SEEN   WBC, UA TOO NUMEROUS TO COUNT 0 - 5 WBC/hpf   RBC / HPF 6-30 0 - 5 RBC/hpf   Bacteria, UA MANY (A) NONE SEEN  Urine culture     Status: None (Preliminary result)   Collection Time: 10/04/15  6:58 AM  Result Value Ref Range   Specimen Description URINE, RANDOM    Special Requests NONE    Culture      >=100,000 COLONIES/mL ESCHERICHIA COLI Performed at [facility]    Report Status PENDING   Basic metabolic panel     Status: Abnormal   Collection Time: 10/04/15  7:47 AM  Result Value Ref Range   Sodium 138 135 - 145 mmol/L   Potassium 3.1 (L) 3.5 - 5.1 mmol/L   Chloride 95 (L) 101 - 111 mmol/L   CO2 32 22 - 32 mmol/L   Glucose, Bld 114 (H) 65 - 99 mg/dL   BUN 8 6 - 20 mg/dL   Creatinine, Ser 0.54 0.44 - 1.00 mg/dL   Calcium 9.1 8.9 - 10.3 mg/dL   GFR calc non Af Amer >60 >60 mL/min   GFR calc Af Amer >60 >60 mL/min    Comment: (NOTE) The eGFR has been calculated using the CKD EPI equation. This calculation has not been validated in all clinical situations. eGFR's persistently <60 mL/min signify possible Chronic Kidney Disease.    Anion gap 11 5 - 15  CBC with Differential     Status: None   Collection Time: 10/04/15  7:47 AM   Result Value Ref Range   WBC 7.7 4.0 - 10.5 K/uL   RBC 4.50 3.87 - 5.11 MIL/uL   Hemoglobin 12.8 12.0 - 15.0 g/dL   HCT 38.2 36.0 - 46.0 %   MCV 84.9 78.0 - 100.0 fL   MCH 28.4 26.0 - 34.0 pg   MCHC 33.5 30.0 - 36.0 g/dL   RDW 14.4 11.5 - 15.5 %   Platelets 293 150 - 400 K/uL   Neutrophils Relative % 80 %   Neutro Abs 6.1 1.7 - 7.7 K/uL   Lymphocytes Relative 10 %   Lymphs Abs 0.8 0.7 - 4.0 K/uL   Monocytes Relative  10 %   Monocytes Absolute 0.7 0.1 - 1.0 K/uL   Eosinophils Relative 0 %   Eosinophils Absolute 0.0 0.0 - 0.7 K/uL   Basophils Relative 0 %   Basophils Absolute 0.0 0.0 - 0.1 K/uL  Basic metabolic panel     Status: Abnormal   Collection Time: 10/04/15  1:28 PM  Result Value Ref Range   Sodium 137 135 - 145 mmol/L   Potassium 3.2 (L) 3.5 - 5.1 mmol/L   Chloride 96 (L) 101 - 111 mmol/L   CO2 32 22 - 32 mmol/L   Glucose, Bld 127 (H) 65 - 99 mg/dL   BUN 7 6 - 20 mg/dL   Creatinine, Ser 0.65 0.44 - 1.00 mg/dL   Calcium 8.8 (L) 8.9 - 10.3 mg/dL   GFR calc non Af Amer >60 >60 mL/min   GFR calc Af Amer >60 >60 mL/min    Comment: (NOTE) The eGFR has been calculated using the CKD EPI equation. This calculation has not been validated in all clinical situations. eGFR's persistently <60 mL/min signify possible Chronic Kidney Disease.    Anion gap 9 5 - 15  Magnesium     Status: None   Collection Time: 10/04/15  1:28 PM  Result Value Ref Range   Magnesium 1.9 1.7 - 2.4 mg/dL  Phosphorus     Status: None   Collection Time: 10/04/15  1:28 PM  Result Value Ref Range   Phosphorus 2.6 2.5 - 4.6 mg/dL  CBC     Status: None   Collection Time: 10/05/15  5:47 AM  Result Value Ref Range   WBC 9.0 4.0 - 10.5 K/uL   RBC 4.62 3.87 - 5.11 MIL/uL   Hemoglobin 13.0 12.0 - 15.0 g/dL   HCT 39.4 36.0 - 46.0 %   MCV 85.3 78.0 - 100.0 fL   MCH 28.1 26.0 - 34.0 pg   MCHC 33.0 30.0 - 36.0 g/dL   RDW 14.7 11.5 - 15.5 %   Platelets 325 150 - 400 K/uL  Basic metabolic panel      Status: Abnormal   Collection Time: 10/05/15  5:47 AM  Result Value Ref Range   Sodium 138 135 - 145 mmol/L   Potassium 3.3 (L) 3.5 - 5.1 mmol/L   Chloride 99 (L) 101 - 111 mmol/L   CO2 29 22 - 32 mmol/L   Glucose, Bld 125 (H) 65 - 99 mg/dL   BUN 10 6 - 20 mg/dL   Creatinine, Ser 0.58 0.44 - 1.00 mg/dL   Calcium 9.2 8.9 - 10.3 mg/dL   GFR calc non Af Amer >60 >60 mL/min   GFR calc Af Amer >60 >60 mL/min    Comment: (NOTE) The eGFR has been calculated using the CKD EPI equation. This calculation has not been validated in all clinical situations. eGFR's persistently <60 mL/min signify possible Chronic Kidney Disease.    Anion gap 10 5 - 15  Glucose, capillary     Status: Abnormal   Collection Time: 10/05/15  7:41 AM  Result Value Ref Range   Glucose-Capillary 125 (H) 65 - 99 mg/dL  TSH     Status: None   Collection Time: 10/05/15 11:01 AM  Result Value Ref Range   TSH 1.151 0.350 - 4.500 uIU/mL  Hepatic function panel     Status: Abnormal   Collection Time: 10/05/15 11:01 AM  Result Value Ref Range   Total Protein 6.7 6.5 - 8.1 g/dL   Albumin 3.4 (L) 3.5 - 5.0  g/dL   AST 16 15 - 41 U/L   ALT 11 (L) 14 - 54 U/L   Alkaline Phosphatase 48 38 - 126 U/L   Total Bilirubin 0.9 0.3 - 1.2 mg/dL   Bilirubin, Direct 0.2 0.1 - 0.5 mg/dL   Indirect Bilirubin 0.7 0.3 - 0.9 mg/dL    Current Facility-Administered Medications  Medication Dose Route Frequency Provider Last Rate Last Dose  . acetaminophen (TYLENOL) tablet 500 mg  500 mg Oral Q6H PRN Robbie Lis, MD      . ALPRAZolam Duanne Moron) tablet 0.5 mg  0.5 mg Oral TID PRN Robbie Lis, MD   0.5 mg at 10/04/15 2113  . amLODipine (NORVASC) tablet 5 mg  5 mg Oral Daily Robbie Lis, MD   5 mg at 10/05/15 1005   And  . benazepril (LOTENSIN) tablet 10 mg  10 mg Oral Daily Robbie Lis, MD   10 mg at 10/05/15 1005  . aspirin chewable tablet 81 mg  81 mg Oral Daily Robbie Lis, MD   81 mg at 10/04/15 1400  . chlorhexidine (PERIDEX)  0.12 % solution 15 mL  15 mL Mouth/Throat BID Robbie Lis, MD   15 mL at 10/04/15 2200  . ciprofloxacin (CIPRO) IVPB 400 mg  400 mg Intravenous Q12H Robbie Lis, MD 200 mL/hr at 10/04/15 2338 400 mg at 10/04/15 2338  . docusate sodium (COLACE) capsule 100 mg  100 mg Oral Q12H Robbie Lis, MD   100 mg at 10/05/15 1005  . feeding supplement (ENSURE ENLIVE) (ENSURE ENLIVE) liquid 237 mL  237 mL Oral BID BM Maricela Bo Ostheim, RD      . hydrALAZINE (APRESOLINE) injection 5 mg  5 mg Intravenous Q6H PRN Robbie Lis, MD   5 mg at 10/04/15 1211  . hydrALAZINE (APRESOLINE) tablet 10 mg  10 mg Oral 4 times per day Robbie Lis, MD   10 mg at 10/05/15 6160  . latanoprost (XALATAN) 0.005 % ophthalmic solution 1 drop  1 drop Both Eyes QHS Robbie Lis, MD   1 drop at 10/04/15 2113  . ondansetron (ZOFRAN) tablet 4 mg  4 mg Oral Q6H PRN Robbie Lis, MD       Or  . ondansetron [facility]) injection 4 mg  4 mg Intravenous Q6H PRN Robbie Lis, MD      . polyethylene glycol (MIRALAX / GLYCOLAX) packet 17 g  17 g Oral Daily Robbie Lis, MD   17 g at 10/05/15 1005  . polyvinyl alcohol (LIQUIFILM TEARS) 1.4 % ophthalmic solution 1 drop  1 drop Both Eyes BID Robbie Lis, MD   1 drop at 10/05/15 0725  . potassium chloride SA (K-DUR,KLOR-CON) CR tablet 20 mEq  20 mEq Oral BID Venetia Maxon Rama, MD   20 mEq at 10/05/15 1005  . pravastatin (PRAVACHOL) tablet 20 mg  20 mg Oral Daily Robbie Lis, MD   20 mg at 10/05/15 1005  . sodium phosphate (FLEET) 7-19 GM/118ML enema 1 enema  1 enema Rectal Daily PRN Venetia Maxon Rama, MD      . traMADol Veatrice Bourbon) tablet 50 mg  50 mg Oral Q6H PRN Robbie Lis, MD   50 mg at 10/05/15 1245    Musculoskeletal: Strength & Muscle Tone: decreased Gait & Station: unable to stand Patient leans: N/A  Psychiatric Specialty Exam: ROS patient complaining about anxiety, depression, dizziness and frequent falls. Patient denied nausea, vomiting, abdominal pain,  chest pain and  shortness of breath No Fever-chills, No Headache, No changes with Vision or hearing, reports vertigo No problems swallowing food or Liquids, No Chest pain, Cough or Shortness of Breath, No Abdominal pain, No Nausea or Vommitting, Bowel movements are regular, No Blood in stool or Urine, No dysuria, No new skin rashes or bruises, No new joints pains-aches,  No new weakness, tingling, numbness in any extremity, No recent weight gain or loss, No polyuria, polydypsia or polyphagia,   A full 10 point Review of Systems was done, except as stated above, all other Review of Systems were negative.  Blood pressure 134/73, pulse 53, temperature 98.2 F (36.8 C), temperature source Oral, resp. rate 20, height 5' 5"  (1.651 m), weight 57.3 kg (126 lb 5.2 oz), SpO2 93 %.Body mass index is 21.02 kg/(m^2).  General Appearance: Guarded  Eye Contact::  Good  Speech:  Clear and Coherent  Volume:  Decreased  Mood:  Anxious and Depressed  Affect:  Appropriate, Congruent and Depressed  Thought Process:  Coherent and Goal Directed  Orientation:  Full (Time, Place, and Person)  Thought Content:  Rumination  Suicidal Thoughts:  No  Homicidal Thoughts:  No  Memory:  Immediate;   Fair Recent;   Fair  Judgement:  Impaired  Insight:  Shallow  Psychomotor Activity:  Decreased  Concentration:  Fair  Recall:  Good  Fund of Knowledge:Good  Language: Good  Akathisia:  Negative  Handed:  Right  AIMS (if indicated):     Assets:  Communication Skills Desire for Improvement Financial Resources/Insurance Housing Leisure Time Resilience Social Support Talents/Skills Transportation  ADL's:  Impaired  Cognition: Impaired,  Mild  Sleep:      Treatment Plan Summary: Daily contact with patient to assess and evaluate symptoms and progress in treatment and Medication management We start hydroxyzine 10 mg 3 times daily for generalized anxiety We will start Effexor XR 37.5 mg daily for depression and  anxiety Patient has no safety concerns and contract for safety Patient has a scheduled shoulder operation  Disposition: Patient does not meet criteria for psychiatric inpatient admission. Supportive therapy provided about ongoing stressors.  Cobain Morici,JANARDHAHA R. 10/05/2015 1:47 PM

## 2015-10-05 NOTE — Progress Notes (Signed)
Increased anxiety throughout shift, constantly ringing bell for toileting issues.  Xanax 0.5mg  adm at 2100 with min relief.  Continue to monitor

## 2015-10-05 NOTE — Care Management Note (Signed)
Case Management Note  Patient Details  Name: Eileen Reeves MRN: [id number] Date of Birth: 1932/07/16  Subjective/Objective:      Syncope versus sepsis reaction              Action/Plan:Date: October 05, 2015 Chart reviewed for concurrent status and case management needs. Will continue to follow patient for changes and needs: Velva Harman, RN, BSN, [place]   [phone]   Expected Discharge Date:   (UNKNOWN)               Expected Discharge Plan:  Home/Self Care  In-House Referral:  NA  Discharge planning Services  CM Consult  Post Acute Care Choice:  NA Choice offered to:  NA  DME Arranged:    DME Agency:     HH Arranged:    [place] Agency:     Status of Service:  Completed, signed off  Medicare Important Message Given:    Date Medicare IM Given:    Medicare IM give by:    Date Additional Medicare IM Given:    Additional Medicare Important Message give by:     If discussed at [place] of Stay Meetings, dates discussed:    Additional Comments:  Leeroy Cha, RN 10/05/2015, 11:27 AM

## 2015-10-05 NOTE — Progress Notes (Addendum)
Progress Note   Eileen Reeves [id number] DOB: 03/12/1932 DOA: 10-11-15 PCP: Abigail Miyamoto, MD   Brief Narrative:   Eileen Reeves is an 81 y.o. female with a PMH of hypertension, dyslipidemia and a left shoulder fracture sustained on 09/28/15 after a fall (scheduled for surgical repair 10/08/15), who was admitted 2015-10-11 with chief complaint of dizziness and gait disturbance. In ED, patient was hemodynamically stable. Blood work was relatively unremarkable except for potassium of 3.1 which was supplemented. MRI of the brain did not reveal acute intracranial findings. Urinalysis however showed large leukocytes, positive nitrites and many bacteria. She was started on empiric Cipro while awaiting urine culture results.  Assessment/Plan:   Principal Problem:  Dizziness secondary to probable vertigo / frequent falls - Unclear etiology, possible vertigo.  Has had frequent falls. - F/Uphysical therapy evaluation/vestibular therapy evaluations. - MRI brain negative for acute intracranial findings. - Continue hydration.  Active Problems:   Left shoulder fracture - S/P CT 09/28/15 with surgery now moved up to 10/06/15 at [facility]. - Will transfer to [facility] in anticipation of surgery.    Severe protein calorie malnutrition - Evaluated by dietician.  Ensure ordered.    Chronic constipation - Start daily MiraLax. Fleet enema PRN.    Paresthesias rule out neuropathy - Check B12, TSH.    Adjustment disorder with mixed anxiety and depressed mood rule out dementia with behavioral disturbance. - Significant family discord between the patient and her daughter. - Daughter relates that her mother has had severe anxiety related to losing a child. - Poor dynamics between the two of them resulting in mutual mistrust. - Check B12, TSH, RPR to rule out reversible causes of dementia. - Psychiatry consult requested, urged them to pursue family counseling after discharge.   UTI (lower urinary  tract infection) - Urinalysis showed large leukocytes and many bacteria in the setting of urinary symptoms. - Patient started on empiric Cipro. Narrow to Ancef (Although PCN allergic, had Rocephin in ED). - Follow-up urine culture results.   Benign essential HTN - Resume home medication, Lotrel. - SBP in 170's so we added hydralazine 10 mg every 6 hours for better blood pressure control.   Hypokalemia - Likely due to Lotrel. - Supplemented.   Dyslipidemia - Continue Pravachol.  DVT prophylaxis  - SCD's bilaterally.    Family Communication/Anticipated D/C date and plan/Code Status   Family Communication: Daughter updated at bedside. Disposition Plan: Likely will need SNF when stable. Anticipated D/C date:   Likely after planned surgical intervention 10/08/15. Code Status: Full code.   IV Access:    Peripheral IV   Procedures and diagnostic studies:   Mr Brain Wo Contrast  Oct 11, 2015  CLINICAL DATA:  81 year old female with dizziness since all arm injury. Several falls. Initial encounter. EXAM: MRI HEAD WITHOUT CONTRAST TECHNIQUE: Multiplanar, multiecho pulse sequences of the brain and surrounding structures were obtained without intravenous contrast. COMPARISON:  None. FINDINGS: Exam is motion degraded. No acute infarct. Prominent small vessel disease type changes. Global atrophy without hydrocephalus. Thickening anterior falx may be related to ossification rather than meningioma or hemorrhage. Major intracranial vascular structures are patent. Post lens replacement. Partially empty sella felt to be an incidental finding. Cervical medullary junction and pineal region unremarkable. Mild spinal stenosis C3-4. IMPRESSION: Exam is motion degraded. No acute infarct. Prominent small vessel disease type changes. Global atrophy without hydrocephalus. Thickening anterior falx may be related to ossification rather than meningioma or hemorrhage. Electronically Signed   By: Genia Del  M.D.   On: 10/04/2015 11:37     Medical Consultants:    Psychiatry  Anti-Infectives:   Anti-infectives    Start     Dose/Rate Route Frequency Ordered Stop   10/04/15 1200  ciprofloxacin (CIPRO) IVPB 400 mg     400 mg 200 mL/hr over 60 Minutes Intravenous Every 12 hours 10/04/15 1154     10/04/15 0830  cefTRIAXone (ROCEPHIN) 1 g in dextrose 5 % 50 mL IVPB     1 g 100 mL/hr over 30 Minutes Intravenous  Once 10/04/15 0829 10/04/15 0935      Subjective:   Eileen Reeves denies any dyuria, fever, chills but has had urgency, frequency with voiding small amounts or nothing at all.  Has had frequent falls and feels light headed and like the room is "spinning". Has chronic constipation and has to use an enema daily.  Complains of bilateral numbness to her fingers.  Daughter is present and speaks for her mother, and there is clear discord between the two of them, mutual mistrust and frustration.  She has had frequent falls at home.  Daughter relates she has chronic constipation and manually disimpacts herself.  Objective:    Filed Vitals:   10/04/15 1230 10/04/15 1258 10/04/15 2115 10/05/15 0640  BP: 136/75 166/61 122/54 134/73  Pulse: 100 98 96 53  Temp:  98.8 F (37.1 C) 99.1 F (37.3 C) 98.2 F (36.8 C)  TempSrc:  Oral Oral Oral  Resp: 18 20 20 20   Height:  5\' 5"  (1.651 m)    Weight:  57.3 kg (126 lb 5.2 oz)    SpO2: 96% 95% 93% 93%    Intake/Output Summary (Last 24 hours) at 10/05/15 1409 Last data filed at 10/05/15 0600  Gross per 24 hour  Intake    400 ml  Output      0 ml  Net    400 ml   Filed Weights   10/04/15 1258  Weight: 57.3 kg (126 lb 5.2 oz)    Exam: Gen:  Anxious, depressed Cardiovascular:  RRR, No M/R/G Respiratory:  Lungs CTAB Gastrointestinal:  Abdomen soft, NT/ND, + BS Extremities:  No C/E/C Neuro: Disconjugate gaze ("lazy eye" on left, long-standing), sensory loss LEs.   Data Reviewed:    Labs: Basic Metabolic Panel:  Recent  Labs Lab 10/04/15 0747 10/04/15 1328 10/05/15 0547  NA 138 137 138  K 3.1* 3.2* 3.3*  CL 95* 96* 99*  CO2 32 32 29  GLUCOSE 114* 127* 125*  BUN 8 7 10   CREATININE 0.54 0.65 0.58  CALCIUM 9.1 8.8* 9.2  MG  --  1.9  --   PHOS  --  2.6  --    GFR Estimated Creatinine Clearance: 49.6 mL/min (by C-G formula based on Cr of 0.58). Liver Function Tests:  Recent Labs Lab 10/05/15 1101  AST 16  ALT 11*  ALKPHOS 48  BILITOT 0.9  PROT 6.7  ALBUMIN 3.4*   No results for input(s): LIPASE, AMYLASE in the last 168 hours. No results for input(s): AMMONIA in the last 168 hours. Coagulation profile No results for input(s): INR, PROTIME in the last 168 hours.  CBC:  Recent Labs Lab 10/04/15 0747 10/05/15 0547  WBC 7.7 9.0  NEUTROABS 6.1  --   HGB 12.8 13.0  HCT 38.2 39.4  MCV 84.9 85.3  PLT 293 325   CBG:  Recent Labs Lab 10/05/15 0741  GLUCAP 125*   Thyroid function studies:  Recent Labs  10/05/15 1101  TSH 1.151   Anemia work up: No results for input(s): VITAMINB12, FOLATE, FERRITIN, TIBC, IRON, RETICCTPCT in the last 72 hours. Microbiology Recent Results (from the past 240 hour(s))  Urine culture     Status: None (Preliminary result)   Collection Time: 10/04/15  6:58 AM  Result Value Ref Range Status   Specimen Description URINE, RANDOM  Final   Special Requests NONE  Final   Culture   Final    >=100,000 COLONIES/mL ESCHERICHIA COLI Performed at [facility]    Report Status PENDING  Incomplete     Medications:   . amLODipine  5 mg Oral Daily   And  . benazepril  10 mg Oral Daily  . aspirin  81 mg Oral Daily  . chlorhexidine  15 mL Mouth/Throat BID  . ciprofloxacin  400 mg Intravenous Q12H  . docusate sodium  100 mg Oral Q12H  . feeding supplement (ENSURE ENLIVE)  237 mL Oral BID BM  . hydrALAZINE  10 mg Oral 4 times per day  . latanoprost  1 drop Both Eyes QHS  . polyethylene glycol  17 g Oral Daily  . polyvinyl alcohol  1 drop Both  Eyes BID  . potassium chloride  20 mEq Oral BID  . pravastatin  20 mg Oral Daily   Continuous Infusions:   Time spent: 1 hour with > 50% of the time discussing the HPI and many concerns of daughter.   LOS: 1 day   Eileen Reeves  [facility] Pager [phone]. If unable to reach me by pager, please call my cell phone at [phone].  *Please refer to [email or web address], password [email or web address] to get updated schedule on who will round on this patient, as hospitalists switch teams weekly. If 7PM-7AM, please contact night-coverage at [email or web address], password [email or web address] for any overnight needs.  10/05/2015, 2:09 PM

## 2015-10-05 NOTE — Evaluation (Signed)
Physical Therapy Evaluation Patient Details Name: Eileen Reeves MRN: [id number] DOB: Jun 22, 1932 Today's Date: 10/05/2015   History of Present Illness  81 y.o. female with a PMH of hypertension, dyslipidemia and a left shoulder fracture sustained on 09/28/15 after a fall (scheduled for surgical repair 10/08/15), who was admitted 10/04/15 with chief complaint of dizziness and gait disturbance  Clinical Impression  Pt admitted with above diagnosis. Pt currently with functional limitations due to the deficits listed below (see PT Problem List).  Pt will benefit from skilled PT to increase their independence and safety with mobility to allow discharge to the venue listed below.  Pt's daughter reports plan to transfer to [facility] for shoulder sx and then to [facility] for rehab.  Vestibular Exam: Pt reports dizziness started upon sitting EOB after awakening.  Dizziness lasted about 15 minutes.  Pt provides poor description of dizziness but does endorse spinning at the time.  Pt currently without any symptoms however reports increased fear of falling.  Oculomotor exam negative; pt does have slight lateral gaze of R eye at rest.  No nystagmus observed during session and pt denies dizziness/spinning.  Did not perform any further vestibular positioning testing due to severe, comminuted shoulder fx.       Follow Up Recommendations SNF    Equipment Recommendations  Other (comment) (to be assessed after shoulder sx)    Recommendations for Other Services       Precautions / Restrictions Precautions Precautions: Fall      Mobility  Bed Mobility Overal bed mobility: Needs Assistance Bed Mobility: Supine to Sit;Sit to Supine     Supine to sit: Supervision;HOB elevated Sit to supine: Supervision;HOB elevated      Transfers Overall transfer level: Needs assistance Equipment used: 1 person hand held assist Transfers: Sit to/from [facility] Sit to Stand: Min assist Stand pivot  transfers: Min assist;+2 safety/equipment       General transfer comment: verbal cues for self assist with R UE, assist to steady and control descent  Ambulation/Gait Ambulation/Gait assistance: Min assist;+2 safety/equipment Ambulation Distance (Feet): 40 Feet Assistive device: 1 person hand held assist Gait Pattern/deviations: Step-through pattern;Narrow base of support     General Gait Details: pt reports fear of falling, denies dizziness, HHA for pt to steady however required occasional external assist for balance at times, again very concerned with falling  Stairs            Wheelchair Mobility    Modified Rankin (Stroke Patients Only)       Balance Overall balance assessment: History of Falls                                           Pertinent Vitals/Pain Pain Assessment: No/denies pain    Home Living Family/patient expects to be discharged to:: Skilled nursing facility Living Arrangements: Alone             Home Equipment: None      Prior Function Level of Independence: Independent         Comments: recent fall with sustained shoulder fx and surgery planned at [facility] then to d/c to SNF     Hand Dominance        Extremity/Trunk Assessment   Upper Extremity Assessment: LUE deficits/detail       LUE Deficits / Details: NT testing performed due to comminuted shoulder fx, remained in  sling   Lower Extremity Assessment: Generalized weakness         Communication   Communication: No difficulties  Cognition Arousal/Alertness: Awake/alert Behavior During Therapy: Flat affect Overall Cognitive Status:  (pyschiatrist into room end of session)                      General Comments      Exercises        Assessment/Plan    PT Assessment Patient needs continued PT services  PT Diagnosis Difficulty walking   PT Problem List Decreased strength;Decreased balance;Decreased mobility;Decreased knowledge of use of  DME  PT Treatment Interventions DME instruction;Gait training;Functional mobility training;Patient/family education;Therapeutic activities;Therapeutic exercise   PT Goals (Current goals can be found in the Care Plan section) Acute Rehab PT Goals PT Goal Formulation: With patient/family Time For Goal Achievement: 10/19/15 Potential to Achieve Goals: Fair    Frequency Min 2X/week   Barriers to discharge        Co-evaluation               End of Session Equipment Utilized During Treatment: Gait belt;Other (comment) (L UE remained in sling) Activity Tolerance: Other (comment) (limited by fear of falling) Patient left: in bed;with call bell/phone within reach;with bed alarm set;with family/visitor present           Time: 1410-1435 PT Time Calculation (min) (ACUTE ONLY): 25 min   Charges:   PT Evaluation $Initial PT Evaluation Tier I: 1 Procedure     PT G Codes:        Marica Trentham,KATHrine E 10/05/2015, 3:02 PM Carmelia Bake, PT, DPT 10/05/2015 Pager: [phone]

## 2015-10-05 NOTE — Progress Notes (Signed)
Initial Nutrition Assessment  DOCUMENTATION CODES:   Severe malnutrition in context of chronic illness  INTERVENTION:  - Continue Ensure Enlive BID, each supplement provides 350 kcal and 20 grams of protein - Encourage PO intakes of meals and supplements - RD will continue to monitor for needs  NUTRITION DIAGNOSIS:   Malnutrition related to chronic illness as evidenced by severe depletion of muscle mass, severe depletion of body fat.  GOAL:   Patient will meet greater than or equal to 90% of their needs  MONITOR:   PO intake, Supplement acceptance, Weight trends, Labs, Skin, I & O's  REASON FOR ASSESSMENT:   Consult Assessment of nutrition requirement/status  ASSESSMENT:   81 year old female with past medical history of hypertension, dyslipidemia, recent fall on 09/28/2015 at which time she sustained a left shoulder fracture and she is due for surgery this coming Friday, 10/08/2015. Patient presented with reports of dizziness which started earlier today prior to this admission. She reports having chronic dizziness but this time she had associated weakness and was unable to ambulate even short distances. Patient tried to walk from one chair to another and she stumbled onto the chair. She reports previous episodes similar to this but no associated weakness at that time. Patient reports no reports of lightheadedness. No reports of loss of consciousness. No reports of chest pain, shortness of breath or palpitations. No fevers or chills. No abdominal pain, nausea or vomiting. No blood in the stool or urine.  Pt seen for consult. BMI indicates normal weight status. No intakes documented since admission. Per pt report, she ate most of a cheese and egg omelet and bacon for breakfast and for lunch ate ~50% of potato soup and cake. Pt denies abdominal pain or nausea after intakes.   Physical assessment indicates severe muscle and fat wasting to upper body; lower body not assessed at this  time. Chart review indicates 14 lb weight loss (10% body weight) in the past 5 months which is significant for time frame.  Unable to obtain further information from pt at this time. She began talking about her dog who is being put to sleep today; consoled pt on this issue and informed her that RD will talk with her at another time.   RN note yesterday (12/5) at 0751 indicates: Pt's daughter reports that the Pt is "obsessed" w/ having bowel movements. Sts "she is terrified that she is going to get a blockage. Before she hurt her arm, she was constantly taking laxatives and giving herself enemas. Now that she has one arm, she will put Vaseline on her finger and put it up there to make herself go."   Pt likely not meeting needs PTA. Will order Ensure and monitor for tolerance/acceptance and adjust as needed. Medications reviewed; 20 mEq KCl BID. Labs reviewed; K: 3.3 mmol/L, Cl: 99 mmol/L.   Diet Order:  Diet regular Room service appropriate?: Yes; Fluid consistency:: Thin  Skin:  Reviewed, no issues  Last BM:  PTA  Height:   Ht Readings from Last 1 Encounters:  10/04/15 5\' 5"  (1.651 m)    Weight:   Wt Readings from Last 1 Encounters:  10/04/15 126 lb 5.2 oz (57.3 kg)    Ideal Body Weight:  56.82 kg (kg)  BMI:  Body mass index is 21.02 kg/(m^2).  Estimated Nutritional Needs:   Kcal:  1200-1430  Protein:  45-55 grams  Fluid:  2 L/day  EDUCATION NEEDS:   No education needs identified at this time  Jarome Matin, RD, LDN Inpatient Clinical Dietitian Pager # [phone] After hours/weekend pager # [phone]

## 2015-10-06 DIAGNOSIS — E43 Unspecified severe protein-calorie malnutrition: Secondary | ICD-10-CM

## 2015-10-06 DIAGNOSIS — S4292XA Fracture of left shoulder girdle, part unspecified, initial encounter for closed fracture: Secondary | ICD-10-CM

## 2015-10-06 LAB — HIV ANTIBODY (ROUTINE TESTING W REFLEX): HIV Screen 4th Generation wRfx: NONREACTIVE

## 2015-10-06 LAB — URINE CULTURE

## 2015-10-06 LAB — RPR: RPR: NONREACTIVE

## 2015-10-06 LAB — GLUCOSE, CAPILLARY: Glucose-Capillary: 128 mg/dL — ABNORMAL HIGH (ref 65–99)

## 2015-10-06 NOTE — Progress Notes (Signed)
[facility] Progress Note    Patient: Eileen Reeves    [id number]  DOB: 12-16-1931     DOA: 10/04/2015 Date of Service: the patient was seen and examined on 10/06/2015 Day 2 of admission.  Subjective: patient presented with complaints of dizziness and lightheadedness. At the time of my evaluation denies having any complaints of acute pain. No nausea no vomiting no abdominal pain no chest pain. No dizziness no lightheadedness. Nutrition: able to tolerate oral diet Activity: working with physical therapy Last BM: 10/06/2015  Assessment and Plan: 1. Adjustment disorder with mixed anxiety and depressed mood Patient was seen by psychiatry. Started on hydroxyzine 9 scheduled as well as Effexor XR 37.5 mg daily for depression and anxiety. Continue rest of the medications.  2.UTI. Urine grows Escherichia coli sensitivity pending. Patient was on Cipro which was switched to Ancef which I will continue. Started 10/04/2015 As per the daughter the patient does frequent enema as herself at home which might be the likely cause of her recurrent UTI. Recommended patient for hand hygiene.  3. Left shoulder fracture Status post fall on 09/28/2015 Surgery scheduled on 10/07/2015 and [facility] Discussed with Dr. Earlie Counts and the patient will be transferred to [facility] for further care.  4. Chronic constipation. Patient does frequent enemas at home. Starting on daily MiraLAX this admission. Continue Colace as well. Recommended patient that the goal will be one regular bowel movement every day.  5. Chronic paraesthesia bilaterally. On and off recurring not present currently. HIV RPR nonreactive, TSH normal. Check 0000000 and folic acid  6.dizziness. Symptom currently resolved. Was on IV hydration. MRI brain negative. Physical therapy evaluation was also unremarkable for vestibular issues. Currently monitor  7. Protein malnutrition. On the supplements.    DVT Prophylaxis: subcutaneous Heparin Nutrition: regular diet, nothing by mouth after midnight for procedure scheduled on 10/07/2015 Advance goals of care discussion: full code  Brief Summary of Hospitalization:  HPI: as per the H&P the patient presented with dizziness and lightheadedness. She was found to be having a possible UTI and due to symptomatic UTI she was recommended to be admitted in the hospital.  Daily update: admitted on 10/04/2015. Antibiotic started. Psychiatry was consulted on 10/05/2015. Seroquel started. Patient will be transferred to [facility] on 10/06/2015 Consultants: psychiatric consultation Procedures: none Antibiotics: Anti-infectives    Start     Dose/Rate Route Frequency Ordered Stop   10/05/15 1500  ceFAZolin (ANCEF) IVPB 1 g/50 mL premix     1 g 100 mL/hr over 30 Minutes Intravenous 3 times per day 10/05/15 1410     10/04/15 1200  ciprofloxacin (CIPRO) IVPB 400 mg  Status:  Discontinued     400 mg 200 mL/hr over 60 Minutes Intravenous Every 12 hours 10/04/15 1154 10/05/15 1410   10/04/15 0830  cefTRIAXone (ROCEPHIN) 1 g in dextrose 5 % 50 mL IVPB     1 g 100 mL/hr over 30 Minutes Intravenous  Once 10/04/15 [id number] 10/04/15 0935      Family Communication: family was present at bedside, opportunity was given to ask question and all questions were answered satisfactorily at the time of interview.  Disposition:  Expected discharge date:10/09/2015 Barriers to safe discharge: postoperative course   Intake/Output Summary (Last 24 hours) at 10/06/15 1455 Last data filed at 10/06/15 0744  Gross per 24 hour  Intake    480 ml  Output    300 ml  Net    180 ml   [employer]  10/04/15 1258  Weight: 57.3 kg (126 lb 5.2 oz)    Objective: Physical Exam: Filed Vitals:   10/05/15 1500 10/05/15 2231 10/06/15 0000 10/06/15 0515  BP: 135/90 136/120 130/92 132/89  Pulse: 69 97  70  Temp: 98.6 F (37 C) 97.5 F (36.4 C)  98.2 F (36.8 C)  TempSrc:  Oral Oral  Oral  Resp: 20 20  20   Height:      Weight:      SpO2: 94% 93%  96%     General: Appear in mild distress, no Rash; Oral Mucosa moist. Cardiovascular: S1 and S2 Present, no Murmur, no JVD Respiratory: Bilateral Air entry present and Clear to Auscultation, no Crackles, no wheezes Abdomen: Bowel Sound present, Soft and no tenderness Extremities: no Pedal edema, no calf tenderness Neurology: Grossly no focal neuro deficit.  Data Reviewed: CBC:  Recent Labs Lab 10/04/15 0747 10/05/15 0547  WBC 7.7 9.0  NEUTROABS 6.1  --   HGB 12.8 13.0  HCT 38.2 39.4  MCV 84.9 85.3  PLT 293 [id number]   Basic Metabolic Panel:  Recent Labs Lab 10/04/15 0747 10/04/15 1328 10/05/15 0547  NA 138 137 138  K 3.1* 3.2* 3.3*  CL 95* 96* 99*  CO2 32 32 29  GLUCOSE 114* 127* 125*  BUN 8 7 10   CREATININE 0.54 0.65 0.58  CALCIUM 9.1 8.8* 9.2  MG  --  1.9  --   PHOS  --  2.6  --    Liver Function Tests:  Recent Labs Lab 10/05/15 1101  AST 16  ALT 11*  ALKPHOS 48  BILITOT 0.9  PROT 6.7  ALBUMIN 3.4*   No results for input(s): LIPASE, AMYLASE in the last 168 hours. No results for input(s): AMMONIA in the last 168 hours.  Cardiac Enzymes: No results for input(s): CKTOTAL, CKMB, CKMBINDEX, TROPONINI in the last 168 hours. BNP (last 3 results) No results for input(s): BNP in the last 8760 hours.  ProBNP (last 3 results) No results for input(s): PROBNP in the last 8760 hours.   CBG:  Recent Labs Lab 10/05/15 0741 10/06/15 0820  GLUCAP 125* 128*    Recent Results (from the past 240 hour(s))  Urine culture     Status: None   Collection Time: 10/04/15  6:58 AM  Result Value Ref Range Status   Specimen Description URINE, RANDOM  Final   Special Requests NONE  Final   Culture   Final    >=100,000 COLONIES/mL ESCHERICHIA COLI Performed at [facility]    Report Status 10/06/2015 FINAL  Final   Organism ID, Bacteria ESCHERICHIA COLI  Final      Susceptibility    Escherichia coli - MIC*    AMPICILLIN <=2 SENSITIVE Sensitive     CEFAZOLIN <=4 SENSITIVE Sensitive     CEFTRIAXONE <=1 SENSITIVE Sensitive     CIPROFLOXACIN <=0.25 SENSITIVE Sensitive     GENTAMICIN <=1 SENSITIVE Sensitive     IMIPENEM <=0.25 SENSITIVE Sensitive     NITROFURANTOIN <=16 SENSITIVE Sensitive     TRIMETH/SULFA <=20 SENSITIVE Sensitive     AMPICILLIN/SULBACTAM <=2 SENSITIVE Sensitive     PIP/TAZO <=4 SENSITIVE Sensitive     * >=100,000 COLONIES/mL ESCHERICHIA COLI     Studies: No results found.   Scheduled Meds: . amLODipine  5 mg Oral Daily   And  . benazepril  10 mg Oral Daily  . aspirin  81 mg Oral Daily  .  ceFAZolin (ANCEF) IV  1 g Intravenous 3  times per day  . chlorhexidine  15 mL Mouth/Throat BID  . docusate sodium  100 mg Oral Q12H  . feeding supplement (ENSURE ENLIVE)  237 mL Oral BID BM  . hydrALAZINE  10 mg Oral 4 times per day  . hydrOXYzine  10 mg Oral TID  . latanoprost  1 drop Both Eyes QHS  . polyethylene glycol  17 g Oral Daily  . polyvinyl alcohol  1 drop Both Eyes BID  . potassium chloride  20 mEq Oral BID  . pravastatin  20 mg Oral Daily  . venlafaxine XR  37.5 mg Oral Q breakfast   Continuous Infusions:   Time spent: 30 minutes  Author: Berle Mull, MD [facility] Pager: [phone] 10/06/2015 2:55 PM  If 7PM-7AM, please contact night-coverage at [email or web address], password [facility]

## 2015-10-06 NOTE — Progress Notes (Signed)
RN called report to [place] at [facility]. Pt awaiting ambulance transportation.

## 2015-10-06 NOTE — Clinical Social Work Psych Assess (Signed)
Clinical Social Work Nature conservation officer  Clinical Social Worker:  Raymondo Band, [place] Date/Time:  10/06/2015, 10:43 AM Referred By:  Physician Date Referred:  10/06/15 Reason for Referral:  Behavioral Health Issues, SNF Placement   Presenting Symptoms/Problems  Presenting Symptoms/Problems(in person's/family's own words):  Patient reports she came to the hospital due to dizziness and a fall. Patient reports "she broke her shoulder and fell after missing her shoe when trying to put it on".    Abuse/Neglect/Trauma History  Abuse/Neglect/Trauma History:  Denies History Abuse/Neglect/Trauma History Comments (indicate dates):  N/A   Psychiatric History  Psychiatric History:  Denies History Psychiatric Medication:  None reported at this time   Current Mental Health Hospitalizations/Previous Mental Health History:  Per patient, "I have never had issues with my mental health". Per daughter, she reports "the patient has some type of mental health issue, however is unsure of what it could be".    Current Provider:  Per daughter, Patient sees at physician at [place] by the name of Dr. Leonides Schanz Place and Date:  [facility]  Current Medications:   Scheduled Meds: . amLODipine  5 mg Oral Daily   And  . benazepril  10 mg Oral Daily  . aspirin  81 mg Oral Daily  .  ceFAZolin (ANCEF) IV  1 g Intravenous 3 times per day  . chlorhexidine  15 mL Mouth/Throat BID  . docusate sodium  100 mg Oral Q12H  . feeding supplement (ENSURE ENLIVE)  237 mL Oral BID BM  . hydrALAZINE  10 mg Oral 4 times per day  . hydrOXYzine  10 mg Oral TID  . latanoprost  1 drop Both Eyes QHS  . polyethylene glycol  17 g Oral Daily  . polyvinyl alcohol  1 drop Both Eyes BID  . potassium chloride  20 mEq Oral BID  . pravastatin  20 mg Oral Daily  . venlafaxine XR  37.5 mg Oral Q breakfast   Continuous Infusions:  PRN Meds:.acetaminophen, ALPRAZolam, ondansetron **OR** ondansetron (ZOFRAN)  IV, sodium phosphate, traMADol    Previous Inpatient Admission/Date/Reason: None reported   Emotional Health/Current Symptoms  Suicide/Self Harm: None Reported Suicide Attempt in Past (date/description):  N/A  Other Harmful Behavior (ex. homicidal ideation) (describe):  N/A   Psychotic/Dissociative Symptoms  Psychotic/Dissociative Symptoms: None Reported Other Psychotic/Dissociative Symptoms:  N/A  Attention/Behavioral Symptoms  Attention/Behavioral Symptoms: Within Normal Limits Other Attention/Behavioral Symptoms: N/A   Cognitive Impairment  Cognitive Impairment:  Within Normal Limits Other Cognitive Impairment:  N/A   Mood and Adjustment  Mood and Adjustment:  Mood Congruent   Stress, Anxiety, Trauma, Any Recent Loss/Stressor  Stress, Anxiety, Trauma, Any Recent Loss/Stressor: Anxiety, None Reported Anxiety (frequency):  N/A  Phobia (specify):  N/A  Compulsive Behavior (specify):  N/A  Obsessive Behavior (specify):  N/A  Other Stress, Anxiety, Trauma, Any Recent Loss/Stressor:  N/A   Substance Abuse/Use  Substance Abuse/Use: None SBIRT Completed (please refer for detailed history): No Self-reported Substance Use (last use and frequency):  N/A  Urinary Drug Screen Completed: No Alcohol Level:  N/A  Environment/Housing/Living Arrangement  Environmental/Housing/Living Arrangement: Stable Housing Who is in the Home:  Patient reports she lives alone here in [place], [place]  Emergency Contact:  Darel Hong [phone]   Financial  Financial: Medicare   Patient's Strengths and Goals  Patient's Strengths and Goals (patient's own words):  Patient reports she enjoys watching television and short walks. Patient also reports she enjoys cooking, however, states "she has become to lazy  to do that".    Clinical Social Worker's Interpretive Summary  Clinical Social Workers Interpretive Summary:  CSW received [facility] consult to speak with patient  regarding Depression and anxiety. CSW introduced self and acknowledged the patient. Patient is alert and oriented, and patient's daughter Margaretha Sheffield is at bedside. Patient provided CSW with permission to speak while daughter is in room. CSW proceeded to complete psych assessment. Patient currently denies any SI/HI/AVH. Patient reports no history of mental health issues, and no prior inpatient hospitalizations. Patient reports her current stressors as "her daughters mouth, however, states I don't want to lose her". Patient reports she lives alone and at baseline could take short walks with her dog and do things for herself. Patient reports a mood of an 8 stating, "I'm naturally an 8". CSW informed patient and family that patient does not meet criteria for inpatient treatment.   CSW spoke with patient and daughter regarding the possible need for short term rehab. CSW informed patient of PT recommendation for short term rehab. Patient and family is agreeable to SNF placement after surgical procedure. Daughter informed CSW that she has already been in contact with [facility] and they are willing to take the patient once medically ready. CSW informed daughter of CSW process, and CSW would follow up with facility regarding information provided. Patient and daughter both agreed for clinical information to be faxed out to the facilities in [facility] as a follow up. Patient voiced that her preference is [facility] and that she would really like to go there. CSW informed patient that CSW will follow up with facility. Daughter stated her other preference would be [facility]. CSW to complete FL2 for MD signature. CSW to initiate SNF placement process.     Disposition  Disposition: Psych Clinical Film/video editor

## 2015-10-07 ENCOUNTER — Encounter (HOSPITAL_COMMUNITY)
Admission: EM | Disposition: A | Discharge status: Skilled Nursing Facility | Payer: Self-pay | Source: Home / Self Care | Attending: Internal Medicine

## 2015-10-07 ENCOUNTER — Inpatient Hospital Stay (HOSPITAL_COMMUNITY): Payer: Medicare Other | Admitting: Anesthesiology

## 2015-10-07 ENCOUNTER — Inpatient Hospital Stay (HOSPITAL_COMMUNITY): Admission: RE | Admit: 2015-10-07 | Payer: Medicare Other | Source: Ambulatory Visit | Admitting: Orthopedic Surgery

## 2015-10-07 ENCOUNTER — Inpatient Hospital Stay (HOSPITAL_COMMUNITY): Payer: Medicare Other

## 2015-10-07 DIAGNOSIS — E785 Hyperlipidemia, unspecified: Secondary | ICD-10-CM

## 2015-10-07 DIAGNOSIS — R42 Dizziness and giddiness: Secondary | ICD-10-CM

## 2015-10-07 DIAGNOSIS — K5900 Constipation, unspecified: Secondary | ICD-10-CM

## 2015-10-07 DIAGNOSIS — F4323 Adjustment disorder with mixed anxiety and depressed mood: Principal | ICD-10-CM

## 2015-10-07 DIAGNOSIS — F411 Generalized anxiety disorder: Secondary | ICD-10-CM

## 2015-10-07 DIAGNOSIS — R202 Paresthesia of skin: Secondary | ICD-10-CM

## 2015-10-07 DIAGNOSIS — Z96619 Presence of unspecified artificial shoulder joint: Secondary | ICD-10-CM

## 2015-10-07 DIAGNOSIS — E876 Hypokalemia: Secondary | ICD-10-CM

## 2015-10-07 HISTORY — PX: REVERSE SHOULDER ARTHROPLASTY: SHX5054

## 2015-10-07 LAB — GLUCOSE, CAPILLARY: Glucose-Capillary: 113 mg/dL — ABNORMAL HIGH (ref 65–99)

## 2015-10-07 SURGERY — ARTHROPLASTY, SHOULDER, TOTAL, REVERSE
Anesthesia: General | Laterality: Left

## 2015-10-07 MED ORDER — FENTANYL CITRATE (PF) 100 MCG/2ML IJ SOLN
INTRAMUSCULAR | Status: DC | PRN
  Administered 2015-10-07 (×2): 50 ug via INTRAVENOUS

## 2015-10-07 MED ORDER — MENTHOL 3 MG MT LOZG: 1.0000 | LOZENGE | OROMUCOSAL | Status: DC | PRN

## 2015-10-07 MED ORDER — PROPOFOL 10 MG/ML IV BOLUS
INTRAVENOUS | Status: DC | PRN
  Administered 2015-10-07: 90 mg via INTRAVENOUS

## 2015-10-07 MED ORDER — ROCURONIUM BROMIDE 100 MG/10ML IV SOLN
INTRAVENOUS | Status: DC | PRN
  Administered 2015-10-07: 35 mg via INTRAVENOUS

## 2015-10-07 MED ORDER — CIPROFLOXACIN HCL 500 MG PO TABS: 500.0000 mg | ORAL_TABLET | Freq: Two times a day (BID) | ORAL | Status: DC

## 2015-10-07 MED ORDER — CLINDAMYCIN PHOSPHATE 600 MG/50ML IV SOLN
600.0000 mg | Freq: Four times a day (QID) | INTRAVENOUS | Status: AC
  Administered 2015-10-07 – 2015-10-08 (×3): 600 mg via INTRAVENOUS
  Filled 2015-10-07 (×3): qty 50

## 2015-10-07 MED ORDER — CLINDAMYCIN PHOSPHATE 900 MG/50ML IV SOLN
900.0000 mg | INTRAVENOUS | Status: AC
  Administered 2015-10-07: 900 mg via INTRAVENOUS
  Filled 2015-10-07: qty 50

## 2015-10-07 MED ORDER — METOCLOPRAMIDE HCL 5 MG PO TABS: 5.0000 mg | ORAL_TABLET | Freq: Three times a day (TID) | ORAL | Status: DC | PRN

## 2015-10-07 MED ORDER — POTASSIUM CHLORIDE CRYS ER 20 MEQ PO TBCR
40.0000 meq | EXTENDED_RELEASE_TABLET | Freq: Once | ORAL | Status: AC
Start: 2015-10-07 — End: 2015-10-09
  Administered 2015-10-09: 40 meq via ORAL
  Filled 2015-10-07 (×2): qty 2

## 2015-10-07 MED ORDER — OXYCODONE HCL 5 MG/5ML PO SOLN: 5.0000 mg | Freq: Once | ORAL | Status: DC | PRN

## 2015-10-07 MED ORDER — GLYCOPYRROLATE 0.2 MG/ML IJ SOLN
INTRAMUSCULAR | Status: DC | PRN
  Administered 2015-10-07: 0.4 mg via INTRAVENOUS

## 2015-10-07 MED ORDER — ONDANSETRON HCL 4 MG/2ML IJ SOLN
INTRAMUSCULAR | Status: DC | PRN
  Administered 2015-10-07: 4 mg via INTRAVENOUS

## 2015-10-07 MED ORDER — METOCLOPRAMIDE HCL 5 MG/ML IJ SOLN: 5.0000 mg | Freq: Three times a day (TID) | INTRAMUSCULAR | Status: DC | PRN

## 2015-10-07 MED ORDER — PHENYLEPHRINE HCL 10 MG/ML IJ SOLN
10.0000 mg | INTRAVENOUS | Status: DC | PRN
  Administered 2015-10-07: 40 ug/min via INTRAVENOUS

## 2015-10-07 MED ORDER — PHENYLEPHRINE HCL 10 MG/ML IJ SOLN
INTRAMUSCULAR | Status: DC | PRN
  Administered 2015-10-07: 120 ug via INTRAVENOUS

## 2015-10-07 MED ORDER — SODIUM CHLORIDE 0.9 % IR SOLN
Status: DC | PRN
  Administered 2015-10-07: 1000 mL

## 2015-10-07 MED ORDER — OXYCODONE HCL 5 MG PO TABS: 5.0000 mg | ORAL_TABLET | Freq: Once | ORAL | Status: DC | PRN

## 2015-10-07 MED ORDER — HYDROCODONE-ACETAMINOPHEN 5-325 MG PO TABS: 1.0000 | ORAL_TABLET | Freq: Four times a day (QID) | ORAL | Status: DC | PRN

## 2015-10-07 MED ORDER — BUPIVACAINE-EPINEPHRINE 0.25% -1:200000 IJ SOLN
INTRAMUSCULAR | Status: DC | PRN
  Administered 2015-10-07: 30 mL

## 2015-10-07 MED ORDER — HYDRALAZINE HCL 20 MG/ML IJ SOLN: 5.0000 mg | INTRAMUSCULAR | Status: DC | PRN

## 2015-10-07 MED ORDER — ACETAMINOPHEN 325 MG PO TABS: 650.0000 mg | ORAL_TABLET | Freq: Four times a day (QID) | ORAL | Status: DC | PRN

## 2015-10-07 MED ORDER — CHLORHEXIDINE GLUCONATE 4 % EX LIQD
60.0000 mL | Freq: Once | CUTANEOUS | Status: DC
Start: 2015-10-07 — End: 2015-10-07
  Filled 2015-10-07: qty 60

## 2015-10-07 MED ORDER — NEOSTIGMINE METHYLSULFATE 10 MG/10ML IV SOLN
INTRAVENOUS | Status: DC | PRN
  Administered 2015-10-07: 3 mg via INTRAVENOUS

## 2015-10-07 MED ORDER — SODIUM CHLORIDE 0.9 % IV SOLN
INTRAVENOUS | Status: DC
  Administered 2015-10-08 – 2015-10-09 (×2): via INTRAVENOUS

## 2015-10-07 MED ORDER — DOCUSATE SODIUM 100 MG PO CAPS
100.0000 mg | ORAL_CAPSULE | Freq: Two times a day (BID) | ORAL | Status: DC
  Administered 2015-10-07 – 2015-10-09 (×4): 100 mg via ORAL
  Filled 2015-10-07 (×4): qty 1

## 2015-10-07 MED ORDER — BUPIVACAINE-EPINEPHRINE (PF) 0.25% -1:200000 IJ SOLN
INTRAMUSCULAR | Status: AC
  Filled 2015-10-07: qty 30

## 2015-10-07 MED ORDER — ACETAMINOPHEN 650 MG RE SUPP: 650.0000 mg | Freq: Four times a day (QID) | RECTAL | Status: DC | PRN

## 2015-10-07 MED ORDER — POLYETHYLENE GLYCOL 3350 17 G PO PACK
17.0000 g | PACK | Freq: Every day | ORAL | Status: DC | PRN
  Filled 2015-10-07: qty 1

## 2015-10-07 MED ORDER — BISACODYL 10 MG RE SUPP: 10.0000 mg | Freq: Every day | RECTAL | Status: DC | PRN

## 2015-10-07 MED ORDER — PHENOL 1.4 % MT LIQD: 1.0000 | OROMUCOSAL | Status: DC | PRN

## 2015-10-07 MED ORDER — ONDANSETRON HCL 4 MG PO TABS: 4.0000 mg | ORAL_TABLET | Freq: Four times a day (QID) | ORAL | Status: DC | PRN

## 2015-10-07 MED ORDER — LACTATED RINGERS IV SOLN: INTRAVENOUS | Status: DC | PRN

## 2015-10-07 MED ORDER — LIDOCAINE HCL (CARDIAC) 20 MG/ML IV SOLN
INTRAVENOUS | Status: DC | PRN
  Administered 2015-10-07: 60 mg via INTRAVENOUS
  Administered 2015-10-07: 40 mg via INTRAVENOUS
  Administered 2015-10-07: 20 mg via INTRAVENOUS

## 2015-10-07 MED ORDER — MORPHINE SULFATE (PF) 2 MG/ML IV SOLN
2.0000 mg | INTRAVENOUS | Status: DC | PRN
  Administered 2015-10-08 (×2): 2 mg via INTRAVENOUS
  Filled 2015-10-07 (×2): qty 1

## 2015-10-07 MED ORDER — LACTATED RINGERS IV SOLN
INTRAVENOUS | Status: DC
  Administered 2015-10-07: 14:00:00 via INTRAVENOUS

## 2015-10-07 MED ORDER — FENTANYL CITRATE (PF) 100 MCG/2ML IJ SOLN: 25.0000 ug | INTRAMUSCULAR | Status: DC | PRN

## 2015-10-07 MED ORDER — ONDANSETRON HCL 4 MG/2ML IJ SOLN: 4.0000 mg | Freq: Four times a day (QID) | INTRAMUSCULAR | Status: DC | PRN

## 2015-10-07 MED ORDER — ONDANSETRON HCL 4 MG/2ML IJ SOLN: 4.0000 mg | Freq: Once | INTRAMUSCULAR | Status: DC | PRN

## 2015-10-07 MED ORDER — HYDROCODONE-ACETAMINOPHEN 5-325 MG PO TABS
1.0000 | ORAL_TABLET | ORAL | Status: DC | PRN
  Administered 2015-10-07 – 2015-10-08 (×4): 2 via ORAL
  Administered 2015-10-09: 1 via ORAL
  Filled 2015-10-07 (×2): qty 2
  Filled 2015-10-07: qty 1
  Filled 2015-10-07 (×2): qty 2

## 2015-10-07 SURGICAL SUPPLY — 68 items
BIT DRILL 170X2.5X (BIT) IMPLANT
BIT DRILL 5/64X5 DISP (BIT) ×3 IMPLANT
BIT DRL 170X2.5X (BIT)
BLADE SAG 18X100X1.27 (BLADE) ×3 IMPLANT
CAPT SHLDR REVTOTAL 2 ×3 IMPLANT
CEMENT BONE DEPUY (Cement) ×3 IMPLANT
CLOSURE WOUND 1/2 X4 (GAUZE/BANDAGES/DRESSINGS) ×1
COVER SURGICAL LIGHT HANDLE (MISCELLANEOUS) ×3 IMPLANT
DRAPE IMP U-DRAPE 54X76 (DRAPES) ×6 IMPLANT
DRAPE INCISE IOBAN 66X45 STRL (DRAPES) ×3 IMPLANT
DRAPE ORTHO SPLIT 77X108 STRL (DRAPES) ×4
DRAPE SURG ORHT 6 SPLT 77X108 (DRAPES) ×2 IMPLANT
DRAPE U-SHAPE 47X51 STRL (DRAPES) ×3 IMPLANT
DRAPE X-RAY CASS 24X20 (DRAPES) IMPLANT
DRILL 2.5 (BIT)
DRSG ADAPTIC 3X8 NADH LF (GAUZE/BANDAGES/DRESSINGS) ×3 IMPLANT
DRSG PAD ABDOMINAL 8X10 ST (GAUZE/BANDAGES/DRESSINGS) ×3 IMPLANT
DURAPREP 26ML APPLICATOR (WOUND CARE) ×3 IMPLANT
ELECT BLADE 4.0 EZ CLEAN MEGAD (MISCELLANEOUS) ×3
ELECT NEEDLE TIP 2.8 STRL (NEEDLE) ×3 IMPLANT
ELECT REM PT RETURN 9FT ADLT (ELECTROSURGICAL) ×3
ELECTRODE BLDE 4.0 EZ CLN MEGD (MISCELLANEOUS) ×1 IMPLANT
ELECTRODE REM PT RTRN 9FT ADLT (ELECTROSURGICAL) ×1 IMPLANT
GAUZE SPONGE 4X4 12PLY STRL (GAUZE/BANDAGES/DRESSINGS) ×3 IMPLANT
GLOVE BIOGEL PI ORTHO PRO 7.5 (GLOVE) ×2
GLOVE BIOGEL PI ORTHO PRO SZ8 (GLOVE) ×2
GLOVE ORTHO TXT STRL SZ7.5 (GLOVE) ×3 IMPLANT
GLOVE PI ORTHO PRO STRL 7.5 (GLOVE) ×1 IMPLANT
GLOVE PI ORTHO PRO STRL SZ8 (GLOVE) ×1 IMPLANT
GLOVE SURG ORTHO 8.5 STRL (GLOVE) ×3 IMPLANT
GOWN STRL REUS W/ TWL LRG LVL3 (GOWN DISPOSABLE) ×1 IMPLANT
GOWN STRL REUS W/ TWL XL LVL3 (GOWN DISPOSABLE) ×2 IMPLANT
GOWN STRL REUS W/TWL LRG LVL3 (GOWN DISPOSABLE) ×2
GOWN STRL REUS W/TWL XL LVL3 (GOWN DISPOSABLE) ×4
HANDPIECE INTERPULSE COAX TIP (DISPOSABLE)
KIT BASIN OR (CUSTOM PROCEDURE TRAY) ×3 IMPLANT
KIT ROOM TURNOVER OR (KITS) ×3 IMPLANT
MANIFOLD NEPTUNE II (INSTRUMENTS) ×3 IMPLANT
NEEDLE 1/2 CIR MAYO (NEEDLE) ×3 IMPLANT
NEEDLE HYPO 25GX1X1/2 BEV (NEEDLE) ×3 IMPLANT
NS IRRIG 1000ML POUR BTL (IV SOLUTION) ×3 IMPLANT
PACK SHOULDER (CUSTOM PROCEDURE TRAY) ×3 IMPLANT
PAD ARMBOARD 7.5X6 YLW CONV (MISCELLANEOUS) ×6 IMPLANT
PIN GUIDE 1.2 (PIN) IMPLANT
PIN GUIDE GLENOPHERE 1.5MX300M (PIN) IMPLANT
PIN METAGLENE 2.5 (PIN) IMPLANT
SET HNDPC FAN SPRY TIP SCT (DISPOSABLE) IMPLANT
SLING ARM FOAM STRAP MED (SOFTGOODS) ×3 IMPLANT
SLING ARM LRG ADULT FOAM STRAP (SOFTGOODS) IMPLANT
SLING ARM MED ADULT FOAM STRAP (SOFTGOODS) IMPLANT
SPONGE GAUZE 4X4 12PLY STER LF (GAUZE/BANDAGES/DRESSINGS) ×3 IMPLANT
SPONGE LAP 18X18 X RAY DECT (DISPOSABLE) IMPLANT
SPONGE LAP 4X18 X RAY DECT (DISPOSABLE) ×3 IMPLANT
STRIP CLOSURE SKIN 1/2X4 (GAUZE/BANDAGES/DRESSINGS) ×2 IMPLANT
SUCTION FRAZIER TIP 10 FR DISP (SUCTIONS) ×3 IMPLANT
SUT FIBERWIRE #2 38 T-5 BLUE (SUTURE) ×12
SUT MNCRL AB 4-0 PS2 18 (SUTURE) ×3 IMPLANT
SUT VIC AB 2-0 CT1 27 (SUTURE) ×2
SUT VIC AB 2-0 CT1 TAPERPNT 27 (SUTURE) ×1 IMPLANT
SUT VICRYL 0 CT 1 36IN (SUTURE) ×3 IMPLANT
SUTURE FIBERWR #2 38 T-5 BLUE (SUTURE) ×4 IMPLANT
SYR CONTROL 10ML LL (SYRINGE) ×3 IMPLANT
TOWEL OR 17X24 6PK STRL BLUE (TOWEL DISPOSABLE) ×3 IMPLANT
TOWEL OR 17X26 10 PK STRL BLUE (TOWEL DISPOSABLE) ×3 IMPLANT
TOWER CARTRIDGE SMART MIX (DISPOSABLE) IMPLANT
TRAY FOLEY CATH 16FRSI W/METER (SET/KITS/TRAYS/PACK) IMPLANT
WATER STERILE IRR 1000ML POUR (IV SOLUTION) ×3 IMPLANT
YANKAUER SUCT BULB TIP NO VENT (SUCTIONS) ×3 IMPLANT

## 2015-10-07 NOTE — Anesthesia Postprocedure Evaluation (Signed)
Anesthesia Post Note  Patient: Eileen Reeves  Procedure(s) Performed: Procedure(s) (LRB): LEFT REVERSE SHOULDER ARTHROPLASTY (Left)  Patient location during evaluation: PACU Anesthesia Type: General and Regional Level of consciousness: awake and awake and alert Pain management: pain level controlled Vital Signs Assessment: post-procedure vital signs reviewed and stable Respiratory status: spontaneous breathing, nonlabored ventilation and patient connected to nasal cannula oxygen Anesthetic complications: no    Last Vitals:  Filed Vitals:   10/07/15 1845 10/07/15 1855  BP:  135/60  Pulse: 35 90  Temp:    Resp: 18 22    Last Pain:  Filed Vitals:   10/07/15 1900  PainSc: 3                  Masato Pettie COKER

## 2015-10-07 NOTE — Consult Note (Signed)
[place] [facility]iat[facility] Consult follow-up  Reason for Consult:  Depression and anxiety Referring Physician:  Dr. Rockne Menghini Patient Identification: Eileen Reeves MRN:  [id number] Principal Diagnosis: Adjustment disorder with mixed anxiety and depressed mood Diagnosis:   Patient Active Problem List   Diagnosis Date Noted  . Adjustment disorder with mixed anxiety and depressed mood [F43.23] 10/05/2015  . Paresthesia of both hands rule out neuropathy [R20.2] 10/05/2015  . Chronic constipation [K59.00] 10/05/2015  . Protein-calorie malnutrition, severe [E43] 10/05/2015  . Shoulder fracture, left [S42.92XA] 10/05/2015  . Dizziness [R42] 10/04/2015  . UTI (lower urinary tract infection) [N39.0] 10/04/2015  . Benign essential HTN [I10] 10/04/2015  . Hypokalemia [E87.6] 10/04/2015  . Dyslipidemia [E78.5] 10/04/2015    Total Time spent with patient: 30 minutes  Subjective:   Eileen Reeves is a 81 y.o. female patient admitted with depression and anxiety and dizziness.  HPI:  Eileen Reeves 82 year old female seen, chart reviewed and case discussed with patient daughter who is at bedside for face-to-face psychiatric and evaluation for depression and anxiety. Patient has been suffering with generalized anxiety over years and recently increased symptoms of depression and feels like her daughter is going to have bonded her and make her to lie down and die. Patient misunderstood when her daughter told her to lie down when she has been feeling dizziness and have frequent falls and called her daughter. Patient seems to be very guarded and does not let any one tell her what to do. She wanted to be independent because she has been independent throughout her life at the same time she is suffering with the increased symptoms of depression anxiety and also physical problems. Patient is having hard time to make a decision about receiving psychiatric services are not medication or not. Patient agrees with  her daughter who wanted her to be on medication management for controlling her symptoms of depression and anxiety. Patient will be transferred to the [facility] for surgery on Friday. Patient wanted to go to the [facility] tomorrow not today. Patient is willing to take medication that helped for anxiety and depression. Past Psychiatric History: Depression and anxiety has no previous acute psychiatric hospitalization.  Interval history: Patient seen today for psychiatric consultation follow-up [facility] because she was transferred from [facility] long for shoulder surgery. Patient reported she was extremely anxious and worried about her surgery. Patient was provided supportive therapy at this time. Patient daughter is at bedside. Patient was taken medication provided to her without adverse affects but currently she was placed on nothing by mouth for surgery. Patient will be followed by his psychiatric consultation when she is successfully completing the surgery and recovery room.  Risk to Self: Is patient at risk for suicide?: No Risk to Others:   Prior Inpatient Therapy:   Prior Outpatient Therapy:    Past Medical History:  Past Medical History  Diagnosis Date  . Dizziness    History reviewed. No pertinent past surgical history. Family History: No family history on file. Family Psychiatric  History: Patient daughter has been taking psychiatric medication management to control her bipolar depression. Social History:  History  Alcohol Use No     History  Drug Use Not on file    Social History   Social History  . Marital Status: Single    Spouse Name: N/A  . Number of Children: N/A  . Years of Education: N/A   Social History Main Topics  . Smoking status: Never Smoker   . Smokeless tobacco: None  .  Alcohol Use: No  . Drug Use: None  . Sexual Activity: Not Asked   Other Topics Concern  . None   Social History Narrative   Additional Social History:                           Allergies:   Allergies  Allergen Reactions  . Tetanus Toxoids Anaphylaxis and Hives  . Penicillins Hives    Has patient had a PCN reaction causing immediate rash, facial/tongue/throat swelling, SOB or lightheadedness with hypotension: no Has patient had a PCN reaction causing severe rash involving mucus membranes or skin necrosis: no Has patient had a PCN reaction that required hospitalization no Has patient had a PCN reaction occurring within the last 10 years: no If all of the above answers are "NO", then may proceed with Cephalosporin use.  Marland Kitchen Phenobarbital Other (See Comments)    Reaction unknown    Labs:  Results for orders placed or performed during the hospital encounter of 10/04/15 (from the past 48 hour(s))  TSH     Status: None   Collection Time: 10/05/15 11:01 AM  Result Value Ref Range   TSH 1.151 0.350 - 4.500 uIU/mL  RPR     Status: None   Collection Time: 10/05/15 11:01 AM  Result Value Ref Range   RPR Ser Ql Non Reactive Non Reactive    Comment: (NOTE) Performed At: [facility] [place], [place] [id number] Lindon Romp MD [id number]   HIV antibody (routine testing) (NOT for [facility])     Status: None   Collection Time: 10/05/15 11:01 AM  Result Value Ref Range   HIV Screen 4th Generation wRfx Non Reactive Non Reactive    Comment: (NOTE) Performed At: [facility] [place], [place] [id number] Lindon Romp MD [id number]   Hepatic function panel     Status: Abnormal   Collection Time: 10/05/15 11:01 AM  Result Value Ref Range   Total Protein 6.7 6.5 - 8.1 g/dL   Albumin 3.4 (L) 3.5 - 5.0 g/dL   AST 16 15 - 41 U/L   ALT 11 (L) 14 - 54 U/L   Alkaline Phosphatase 48 38 - 126 U/L   Total Bilirubin 0.9 0.3 - 1.2 mg/dL   Bilirubin, Direct 0.2 0.1 - 0.5 mg/dL   Indirect Bilirubin 0.7 0.3 - 0.9 mg/dL  Glucose, capillary     Status: Abnormal   Collection Time: 10/06/15  8:20 AM  Result Value Ref  Range   Glucose-Capillary 128 (H) 65 - 99 mg/dL   Comment 1 Notify RN   Glucose, capillary     Status: Abnormal   Collection Time: 10/07/15  7:36 AM  Result Value Ref Range   Glucose-Capillary 113 (H) 65 - 99 mg/dL   Comment 1 Notify RN     Current Facility-Administered Medications  Medication Dose Route Frequency Provider Last Rate Last Dose  . acetaminophen (TYLENOL) tablet 500 mg  500 mg Oral Q6H PRN Robbie Lis, MD   500 mg at 10/05/15 1608  . ALPRAZolam Duanne Moron) tablet 0.5 mg  0.5 mg Oral TID PRN Robbie Lis, MD   0.5 mg at 10/07/15 [id number]  . amLODipine (NORVASC) tablet 5 mg  5 mg Oral Daily Robbie Lis, MD   5 mg at 10/06/15 [id number]   And  . benazepril (LOTENSIN) tablet 10 mg  10 mg Oral Daily Robbie Lis, MD   10 mg  at 10/06/15 0947  . aspirin chewable tablet 81 mg  81 mg Oral Daily Robbie Lis, MD   81 mg at 10/04/15 1400  . ceFAZolin (ANCEF) IVPB 1 g/50 mL premix  1 g Intravenous 3 times per day Venetia Maxon Rama, MD   1 g at 10/07/15 [id number]  . chlorhexidine (PERIDEX) 0.12 % solution 15 mL  15 mL Mouth/Throat BID Robbie Lis, MD   15 mL at 10/06/15 2340  . docusate sodium (COLACE) capsule 100 mg  100 mg Oral Q12H Robbie Lis, MD   100 mg at 10/06/15 2339  . feeding supplement (ENSURE ENLIVE) (ENSURE ENLIVE) liquid 237 mL  237 mL Oral BID BM Maricela Bo Ostheim, RD   237 mL at 10/06/15 0958  . hydrALAZINE (APRESOLINE) tablet 10 mg  10 mg Oral 4 times per day Robbie Lis, MD   10 mg at 10/07/15 [id number]  . hydrOXYzine (ATARAX/VISTARIL) tablet 10 mg  10 mg Oral TID Ambrose Finland, MD   10 mg at 10/06/15 2339  . latanoprost (XALATAN) 0.005 % ophthalmic solution 1 drop  1 drop Both Eyes QHS Robbie Lis, MD   1 drop at 10/06/15 1416  . ondansetron (ZOFRAN) tablet 4 mg  4 mg Oral Q6H PRN Robbie Lis, MD       Or  . ondansetron [facility]) injection 4 mg  4 mg Intravenous Q6H PRN Robbie Lis, MD      . polyethylene glycol [facility] / GLYCOLAX) packet 17 g  17 g Oral Daily  Robbie Lis, MD   17 g at 10/06/15 0948  . polyvinyl alcohol (LIQUIFILM TEARS) 1.4 % ophthalmic solution 1 drop  1 drop Both Eyes BID Robbie Lis, MD   1 drop at 10/06/15 2340  . potassium chloride SA (K-DUR,KLOR-CON) CR tablet 20 mEq  20 mEq Oral BID Venetia Maxon Rama, MD   20 mEq at 10/06/15 2339  . pravastatin (PRAVACHOL) tablet 20 mg  20 mg Oral Daily Robbie Lis, MD   20 mg at 10/06/15 0947  . sodium phosphate (FLEET) 7-19 GM/118ML enema 1 enema  1 enema Rectal Daily PRN Venetia Maxon Rama, MD      . traMADol Veatrice Bourbon) tablet 50 mg  50 mg Oral Q6H PRN Robbie Lis, MD   50 mg at 10/07/15 [id number]  . venlafaxine XR (EFFEXOR-XR) 24 hr capsule 37.5 mg  37.5 mg Oral Q breakfast Ambrose Finland, MD   37.5 mg at 10/06/15 [id number]    Musculoskeletal: Strength & Muscle Tone: decreased Gait & Station: unable to stand Patient leans: N/A  Psychiatric Specialty Exam: ROS patient complaining about anxiety, depression, dizziness and frequent falls. Patient denied nausea, vomiting, abdominal pain, chest pain and shortness of breath.  Blood pressure 154/62, pulse 91, temperature 97.9 F (36.6 C), temperature source Oral, resp. rate 16, height 5\' 5"  (1.651 m), weight 61.4 kg (135 lb 5.8 oz), SpO2 94 %.Body mass index is 22.53 kg/(m^2).  General Appearance: Guarded  Eye Contact::  Good  Speech:  Clear and Coherent  Volume:  Decreased  Mood:  Anxious and Depressed  Affect:  Appropriate, Congruent and Depressed  Thought Process:  Coherent and Goal Directed  Orientation:  Full (Time, Place, and Person)  Thought Content:  Rumination  Suicidal Thoughts:  No  Homicidal Thoughts:  No  Memory:  Immediate;   Fair Recent;   Fair  Judgement:  Impaired  Insight:  Shallow  Psychomotor Activity:  Decreased  Concentration:  Fair  Recall:  Roel Cluck of Knowledge:Good  Language: Good  Akathisia:  Negative  Handed:  Right  AIMS (if indicated):     Assets:  Communication Skills Desire for  Improvement Financial Resources/Insurance Housing Leisure Time Resilience Social Support Talents/Skills Transportation  ADL's:  Impaired  Cognition: Impaired,  Mild  Sleep:      Treatment Plan Summary: Daily contact with patient to assess and evaluate symptoms and progress in treatment and Medication management Continue hydroxyzinee10 mg 3 times daily for generalized anxiety Continue EffexorrXR 37.5 mg daily for depression and anxiety Patient has no safety concerns and contract for safety Patient has a scheduled shoulder operation at [facility] today at 2 PM and provided supportive therapy   Disposition: Patient does not meet criteria for psychiatric inpatient admission. Supportive therapy provided about ongoing stressors.  Eileen Reeves,Eileen R. 10/07/2015 10:33 AM

## 2015-10-07 NOTE — Progress Notes (Signed)
Patient ID: Eileen Reeves, female   DOB: 14-Feb-1932, 81 y.o.   MRN: [id number]  [facility] PROGRESS NOTE  Eileen Reeves [id number] DOB: [birth year] DOA: 10-19-15 PCP: Abigail Miyamoto, MD  Brief narrative:    Pt is 81 yo female who presented initially to [facility] ED with dizziness and lightheadedness and was found to have UTI and left should fracture. She was transferred to [facility] for surgical evaluation.   Assessment/Plan:    1.  Adjustment disorder with mixed anxiety and depressed mood - psychiatry following, appreciate input  - Started on hydroxyzine scheduled as well as Effexor XR 37.5 mg daily for depression and anxiety.  2.  UTI - E. Coli pan sensitive - Ancef started 19-Oct-2015, continue for now until post op  3.  Left shoulder fracture - Status post fall on 09/28/2015 - Surgery scheduled on 10/07/2015 at [facility] - Discussed with Dr. Earlie Counts , plan to take to OR today   4.  Chronic constipation - daily MiraLAX and colace   5.  Chronic paraesthesia bilaterally - RPR nonreactive, TSH normal. Check 0000000 and folic acid  6.  Dizziness - currently resolved. - MRI brain negative. - PT eval requested   7.  Protein malnutrition - nutritionist consulted   8.  Accelerated HTN - SBP still in 170's - currently on benazepril, hydralazine, Norvasc - will add hydralazine as needed for better BP control   9.  Hypokalemia - supplement and repeat BMP in AM  DVT prophylaxis - Heparin SQ  Code Status: Full.  Family Communication:  plan of care discussed with the patient, no family at bedside  Disposition Plan: SNF when cleared by psych and ortho teams   IV access:  Peripheral IV  Procedures and diagnostic studies:    Mr Brain Wo Contrast 10-19-2015  Exam is motion degraded. No acute infarct. Prominent small vessel disease type changes. Global atrophy without hydrocephalus. Thickening anterior falx may be related to ossification rather than meningioma or  hemorrhage.   Ct Shoulder Left Wo Contrast 09/28/2015  Severely comminuted and complex fracture involving the humeral head neck. Multipart fracture with a head split. The largest component of the articular surface is rotated posteriorly and not articulating with the glenoid. Please see 3D images. Multiple small fracture fragments in the joint. Possible small fracture involving the superior margin of the glenoid posteriorly.   Dg Shoulder Left 09/28/2015  Impacted fracture of the left humeral head. Fracture of the left humeral neck. Consider CT for further anatomic evaluation.   Medical Consultants:  Psych Ortho    Other Consultants:  PT/OT - SNF recommended   IAnti-Infectives:   None   Faye Ramsay, MD  [facility] Pager [phone]  If 7PM-7AM, please contact night-coverage [email or web address] Password [email or web address] 10/07/2015, 11:23 AM   LOS: 3 days   HPI/Subjective: No events overnight.   Objective: Filed Vitals:   10/06/15 0515 10/06/15 1509 10/06/15 2028 10/07/15 0512  BP: 132/89 172/53 136/70 154/62  Pulse: 70 58 86 91  Temp: 98.2 F (36.8 C) 98.3 F (36.8 C) 98 F (36.7 C) 97.9 F (36.6 C)  TempSrc: Oral Oral Oral Oral  Resp: 20 20 18 16   Height:      Weight:    61.4 kg (135 lb 5.8 oz)  SpO2: 96% 96% 95% 94%    Intake/Output Summary (Last 24 hours) at 10/07/15 1123 Last data filed at 10/07/15 0900  Gross per 24 hour  Intake      0 ml  Output  0 ml  Net      0 ml    Exam:   General:  Pt is alert, follows commands appropriately, not in acute distress  Cardiovascular: Regular rate and rhythm, no rubs, no gallops  Respiratory: Clear to auscultation bilaterally, no wheezing, no crackles, no rhonchi  Abdomen: Soft, non tender, non distended, bowel sounds present, no guarding  Data Reviewed: Basic Metabolic Panel:  Recent Labs Lab 10/04/15 0747 10/04/15 1328 10/05/15 0547  NA 138 137 138  K 3.1* 3.2* 3.3*  CL 95* 96* 99*  CO2 32 32 29  GLUCOSE 114* 127*  125*  BUN 8 7 10   CREATININE 0.54 0.65 0.58  CALCIUM 9.1 8.8* 9.2  MG  --  1.9  --   PHOS  --  2.6  --    Liver Function Tests:  Recent Labs Lab 10/05/15 1101  AST 16  ALT 11*  ALKPHOS 48  BILITOT 0.9  PROT 6.7  ALBUMIN 3.4*   CBC:  Recent Labs Lab 10/04/15 0747 10/05/15 0547  WBC 7.7 9.0  NEUTROABS 6.1  --   HGB 12.8 13.0  HCT 38.2 39.4  MCV 84.9 85.3  PLT 293 325   CBG:  Recent Labs Lab 10/05/15 0741 10/06/15 0820 10/07/15 0736  GLUCAP 125* 128* 113*    Recent Results (from the past 240 hour(s))  Urine culture     Status: None   Collection Time: 10/04/15  6:58 AM  Result Value Ref Range Status   Specimen Description URINE, RANDOM  Final   Special Requests NONE  Final   Culture   Final    >=100,000 COLONIES/mL ESCHERICHIA COLI Performed at [facility]    Report Status 10/06/2015 FINAL  Final   Organism ID, Bacteria ESCHERICHIA COLI  Final      Susceptibility   Escherichia coli - MIC*    AMPICILLIN <=2 SENSITIVE Sensitive     CEFAZOLIN <=4 SENSITIVE Sensitive     CEFTRIAXONE <=1 SENSITIVE Sensitive     CIPROFLOXACIN <=0.25 SENSITIVE Sensitive     GENTAMICIN <=1 SENSITIVE Sensitive     IMIPENEM <=0.25 SENSITIVE Sensitive     NITROFURANTOIN <=16 SENSITIVE Sensitive     TRIMETH/SULFA <=20 SENSITIVE Sensitive     AMPICILLIN/SULBACTAM <=2 SENSITIVE Sensitive     PIP/TAZO <=4 SENSITIVE Sensitive     * >=100,000 COLONIES/mL ESCHERICHIA COLI   Scheduled Meds:   . amLODipine  5 mg Oral Daily  . benazepril  10 mg Oral Daily  . aspirin  81 mg Oral Daily  . ceFAZolin  IV  1 g Intravenous 3 times per day  . hydrALAZINE  10 mg Oral 4 times per day  . hydrOXYzine  10 mg Oral TID  . pravastatin  20 mg Oral Daily  . venlafaxine XR  37.5 mg Oral Q breakfast

## 2015-10-07 NOTE — Brief Op Note (Signed)
10/04/2015 - 10/07/2015  5:43 PM  PATIENT:  Eileen Reeves  81 y.o. female  PRE-OPERATIVE DIAGNOSIS:  LEFT PROXIMAL HUMERUS FRACTURE, DISPLACED  POST-OPERATIVE DIAGNOSIS:  LEFT PROXIMAL HUMERUS FRACTURE,DISPLACED  PROCEDURE:  Procedure(s): LEFT REVERSE SHOULDER ARTHROPLASTY (Left) DePuy Delta Xtend with repair of subscapularis  SURGEON:  Surgeon(s) and Role:    * Netta Cedars, MD - Primary  PHYSICIAN ASSISTANT:   ASSISTANTS: Ventura Bruns, PA-C   ANESTHESIA:   regional and general  EBL:  Total I/O In: 0  Out: 50 [Blood:50]  BLOOD ADMINISTERED:none  DRAINS: none   LOCAL MEDICATIONS USED:  MARCAINE     SPECIMEN:  No Specimen  DISPOSITION OF SPECIMEN:  N/A  COUNTS:  YES  TOURNIQUET:  * No tourniquets in log *  DICTATION: .Other Dictation: Dictation Number [id number]  PLAN OF CARE: Admit to inpatient   PATIENT DISPOSITION:  PACU - hemodynamically stable.   Delay start of Pharmacological VTE agent (>24hrs) due to surgical blood loss or risk of bleeding: no

## 2015-10-07 NOTE — Clinical Social Work Note (Signed)
Clinical Social Work Assessment  Patient Details  Name: Eileen Reeves MRN: [id number] Date of Birth: 11/02/1931  Date of referral:  10/07/15               Reason for consult:  Facility Placement                Permission sought to share information with:  Family Supports, Chartered certified accountant granted to share information::  Yes, Verbal Permission Granted  Name::     Health and safety inspector::  [facility]  Relationship::  dtr  Contact Information:     Housing/Transportation Living arrangements for the past 2 months:  Apartment Source of Information:  Patient, Adult Children Patient Interpreter Needed:  None Criminal Activity/Legal Involvement Pertinent to Current Situation/Hospitalization:  No - Comment as needed Significant Relationships:  Adult Children Lives with:  Self Do you feel safe going back to the place where you live?  No Need for family participation in patient care:  No (Coment)  Care giving concerns:  Pt lives alone and does not have sufficient support given current level of impairment   Facilities manager / plan:  CSW spoke with pt and pt dtr concerning PT recommendation for SNF  Employment status:  Retired Forensic scientist:  Medicare PT Recommendations:  [place] / Referral to community resources:  [place]  Patient/Family's Response to care:  Pt is agreeable to SNF placement at [facility] and family has already discussed admission with [place] facility.  Patient/Family's Understanding of and Emotional Response to Diagnosis, Current Treatment, and Prognosis:  No questions or concerns at this time.  Emotional Assessment Appearance:  Appears stated age Attitude/Demeanor/Rapport:    Affect (typically observed):  Appropriate, Guarded Orientation:  Oriented to Self, Oriented to Place, Oriented to  Time, Oriented to Situation Alcohol / Substance use:  Not Applicable Psych involvement  (Current and /or in the community):  No (Comment)  Discharge Needs  Concerns to be addressed:  Care Coordination Readmission within the last 30 days:  No Current discharge risk:  Physical Impairment Barriers to Discharge:  Continued Medical Work up   Cranford Mon, LCSW 10/07/2015, 4:49 PM

## 2015-10-07 NOTE — Op Note (Signed)
Eileen Reeves, Eileen Reeves            ACCOUNT NO.:  [id number]  MEDICAL RECORD NO.:  [id number]  LOCATION:  [id number]                        FACILITY:  [facility]  PHYSICIAN:  Eileen Reeves. Eileen Reeves, M.D. DATE OF BIRTH:  30-Oct-1932  DATE OF PROCEDURE:  10/07/2015 DATE OF DISCHARGE:                              OPERATIVE REPORT   PREOPERATIVE DIAGNOSIS:  Left comminuted displaced proximal humerus fracture.  POSTOPERATIVE DIAGNOSIS:  Left comminuted displaced proximal humerus fracture.  PROCEDURE PERFORMED:  Left shoulder reverse total shoulder arthroplasty using DePuy Delta Xtend prosthesis with repair of the subscapularis.  ATTENDING SURGEON:  Eileen Reeves. Eileen Fells, MD.  ASSISTANT:  Abbott Pao. Dixon, PA-C who was scrubbed the entire procedure and necessary for satisfactory completion of surgery.  ANESTHESIA:  General anesthesia plus interscalene block was used.  ESTIMATED BLOOD LOSS:  150 mL.  FLUID REPLACEMENT:  1200 mL crystalloid.  INSTRUMENT COUNTS:  Correct.  COMPLICATIONS:  There were no complications.  ANTIBIOTICS:  Perioperative antibiotics were given.  INDICATIONS:  The patient is an 81 year old female who suffered a fall injuring her left shoulder.  The patient has a comminuted displaced proximal humerus fracture and is referred to Orthopedics.  She then was admitted to the hospital with urosepsis and was medically optimized. Once cleared by Internal Medicine, the patient was brought to the operating theater for definitive fixation and stabilization of her left shoulder.  Given the comminuted nature of her fracture with the humeral head being completely flipped 180 degrees wrong direction, we recommended a reverse shoulder arthroplasty.  The patient and family agreed.  Risks and benefits of surgery discussed and informed consent obtained.  DESCRIPTION OF PROCEDURE:  After an adequate level of anesthesia was achieved, the patient was positioned in modified beach-chair  position. Left shoulder was correctly identified, sterilely prepped and draped in usual manner.  Time-out called.  We entered the shoulder using standard deltopectoral approach starting at the coracoid process and extending down to the anterior humerus.  Dissection down through subcutaneous tissues using the Bovie.  We identified the cephalic vein, took it laterally the deltoid, pectoralis taken medially.  Conjoint tendon retracted medially.  Subscapularis identified and dissected free from some of the scar tissue.  This was at least a week plus old and we were able to identify the remnant of lesser tuberosity and subscap, placed a #2 FiberWire suture in a modified Debbie stitch for potential repair of the subscap and the rotator cuff. At the end of surgery, humeral head essentially was free and was under the deltoid and we flipped it out.  It was completely non repairable. The rotator cuff and greater tuberosity was in fragments.  Posteriorly, this was not really amenable to repair either and we removed the greater tuberosity bone fragments.  I was able to sweep my finger along the posterior aspect of the glenoid and make sure there was no other loose bone around the posterior aspect of the humeral shaft and made sure that we protected the axillary nerve.  We then went ahead and prepared the glenoid where we did a 360-degree glenoid labrum removal, removed the cartilage from the glenoid face, found the center point for glenoid and drilled a central guidepin.  We  then reamed for the metaglene and then drilled the central peg hole for the metaglene.  We impacted the standard Delta metaglene from DePuy into position referencing the inferior scapular pillar.  We placed our 2 lock screws, 36 inferiorly and 36 into the base of the coracoid with good purchase.  We then placed 18 nonlocked anteriorly and posteriorly.  We had excellent secure to the metaglene and then placed a 38 standard  glenosphere in position, impacted that and then screwed it.  We checked our axillary nerve and it was free and clear.  We irrigated thoroughly.  We then went ahead and prepared the humerus by reaming up to a size 10 humeral shaft diameter that we took at epi 1 center metaphysis set on 0 and impacted that in 10 degrees of retroversion.  Once we had that in proper position, we reduced the shoulder with a 38+ 3 poly and we were really happy with the humeral fixation and the trial size.  So we removed the humeral trials, irrigated the shaft, dried the shaft with a sponge, and then cemented the real.  This was a hybrid so we used a [place] distally for cementing and then a HA proximal body epi 1 centered set on 0. Again, we put in 10 degrees of retroversion.  Once we used a DePuy high viscosity cement, hand packed that into the humeral canal and then placed the stem.  We were pleased with the position of the stem, allowed the cement to harden.  We also placed #2 FiberWire suture for repair of the subscap to the and through drill holes.  Once the cement hardened, we went ahead then and re-trialed with +3 and were happy, so we selected real 38+ 3 poly, impacted that on the humerus, did a final check for any foreign bodies or loose debris.  We irrigated thoroughly and then reduced the shoulder with a nice pop.  We had nice stability, negative sulcus, negative gapping with external rotation.  Then, we went ahead and irrigated thoroughly.  We then repaired the subscapularis anatomically back to the lesser tuberosity area and the anterior aspect of the implant.  I did not restrict motion at all and felt like this may add to stability of strength and function of the shoulder.  Again, the rotator cuff was not repairable including supraspinatus, infraspinatus, and teres minor.  We irrigated thoroughly and then repaired deltopectoral interval with 0 Vicryl suture, followed by 2-0 Vicryl  subcutaneous closure, and 4-0 Monocryl for skin.  Steri-Strips applied followed by sterile dressing.  The patient tolerated the surgery well.     Eileen Reeves. Eileen Reeves, M.D.     SRN/MEDQ  D:  10/07/2015  T:  10/07/2015  Job:  [id number]

## 2015-10-07 NOTE — NC FL2 (Signed)
[place] LEVEL OF CARE SCREENING TOOL     IDENTIFICATION  Patient Name: Eileen Reeves Birthdate: 1932-09-07 Sex: female Admission Date (Current Location): 10/04/2015  [facility] and [place] Number: Herbalist and Address:  [facility] [place], [place] [address] [place], [place] [address]      Provider Number: [id number]  Attending Physician Name and Address:  Theodis Blaze, MD  Relative Name and Phone Number:       Current Level of Care: Hospital Recommended Level of Care: [place] Prior Approval Number:    Date Approved/Denied:   PASRR Number:    Discharge Plan: SNF    Current Diagnoses: Patient Active Problem List   Diagnosis Date Noted  . Adjustment disorder with mixed anxiety and depressed mood 10/05/2015  . Paresthesia of both hands rule out neuropathy 10/05/2015  . Chronic constipation 10/05/2015  . Protein-calorie malnutrition, severe 10/05/2015  . Shoulder fracture, left 10/05/2015  . Dizziness 10/04/2015  . UTI (lower urinary tract infection) 10/04/2015  . Benign essential HTN 10/04/2015  . Hypokalemia 10/04/2015  . Dyslipidemia 10/04/2015    Orientation ACTIVITIES/SOCIAL BLADDER RESPIRATION    Self, Time, Situation, Place    Continent Normal  BEHAVIORAL SYMPTOMS/MOOD NEUROLOGICAL BOWEL NUTRITION STATUS      Continent Diet (Regular)  PHYSICIAN VISITS COMMUNICATION OF NEEDS Height & Weight Skin    Verbally 5\' 5"  (165.1 cm) 135 lbs. Surgical wounds          AMBULATORY STATUS RESPIRATION    Assist extensive Normal      Personal Care Assistance Level of Assistance  Bathing, Dressing Bathing Assistance: Limited assistance   Dressing Assistance: Limited assistance      Functional Limitations Info                SPECIAL CARE FACTORS FREQUENCY  PT (By licensed PT)     PT Frequency: 5/wk             Additional Factors Info  Code Status, Allergies Code Status Info:  FULL Allergies Info: tetanus toxoids, penicillins, phenobarbital           Current Medications (10/07/2015):  This is the current hospital active medication list Current Facility-Administered Medications  Medication Dose Route Frequency Provider Last Rate Last Dose  . acetaminophen (TYLENOL) tablet 500 mg  500 mg Oral Q6H PRN Robbie Lis, MD   500 mg at 10/05/15 1608  . ALPRAZolam Duanne Moron) tablet 0.5 mg  0.5 mg Oral TID PRN Robbie Lis, MD   0.5 mg at 10/07/15 [id number]  . amLODipine (NORVASC) tablet 5 mg  5 mg Oral Daily Robbie Lis, MD   5 mg at 10/06/15 0947   And  . benazepril (LOTENSIN) tablet 10 mg  10 mg Oral Daily Robbie Lis, MD   10 mg at 10/06/15 0947  . aspirin chewable tablet 81 mg  81 mg Oral Daily Robbie Lis, MD   81 mg at 10/04/15 1400  . ceFAZolin (ANCEF) IVPB 1 g/50 mL premix  1 g Intravenous 3 times per day Venetia Maxon Rama, MD   1 g at 10/07/15 [id number]  . chlorhexidine (PERIDEX) 0.12 % solution 15 mL  15 mL Mouth/Throat BID Robbie Lis, MD   15 mL at 10/06/15 2340  . docusate sodium (COLACE) capsule 100 mg  100 mg Oral Q12H Robbie Lis, MD   100 mg at 10/06/15 2339  . feeding supplement (ENSURE ENLIVE) (ENSURE ENLIVE) liquid  237 mL  237 mL Oral BID BM Maricela Bo Ostheim, RD   237 mL at 10/06/15 0958  . hydrALAZINE (APRESOLINE) tablet 10 mg  10 mg Oral 4 times per day Robbie Lis, MD   10 mg at 10/07/15 [id number]  . hydrOXYzine (ATARAX/VISTARIL) tablet 10 mg  10 mg Oral TID Ambrose Finland, MD   10 mg at 10/06/15 2339  . latanoprost (XALATAN) 0.005 % ophthalmic solution 1 drop  1 drop Both Eyes QHS Robbie Lis, MD   1 drop at 10/06/15 1416  . ondansetron (ZOFRAN) tablet 4 mg  4 mg Oral Q6H PRN Robbie Lis, MD       Or  . ondansetron [facility]) injection 4 mg  4 mg Intravenous Q6H PRN Robbie Lis, MD      . polyethylene glycol [facility] / GLYCOLAX) packet 17 g  17 g Oral Daily Robbie Lis, MD   17 g at 10/06/15 0948  . polyvinyl alcohol (LIQUIFILM TEARS) 1.4  % ophthalmic solution 1 drop  1 drop Both Eyes BID Robbie Lis, MD   1 drop at 10/06/15 2340  . potassium chloride SA (K-DUR,KLOR-CON) CR tablet 20 mEq  20 mEq Oral BID Venetia Maxon Rama, MD   20 mEq at 10/06/15 2339  . pravastatin (PRAVACHOL) tablet 20 mg  20 mg Oral Daily Robbie Lis, MD   20 mg at 10/06/15 0947  . sodium phosphate (FLEET) 7-19 GM/118ML enema 1 enema  1 enema Rectal Daily PRN Venetia Maxon Rama, MD      . traMADol Veatrice Bourbon) tablet 50 mg  50 mg Oral Q6H PRN Robbie Lis, MD   50 mg at 10/07/15 [id number]  . venlafaxine XR (EFFEXOR-XR) 24 hr capsule 37.5 mg  37.5 mg Oral Q breakfast Ambrose Finland, MD   37.5 mg at 10/06/15 [id number]     Discharge Medications: Please see discharge summary for a list of discharge medications.  Relevant Imaging Results:  Relevant Lab Results:  Recent Labs    Additional Information SS#: [id number]  Cranford Mon, LCSW

## 2015-10-07 NOTE — Interval H&P Note (Signed)
History and Physical Interval Note:  10/07/2015 2:59 PM  Eileen Reeves  has presented today for surgery, with the diagnosis of LEFT PROXIMAL HUMERUS FRACTURE  The various methods of treatment have been discussed with the patient and family. After consideration of risks, benefits and other options for treatment, the patient has consented to  Procedure(s): LEFT REVERSE SHOULDER ARTHROPLASTY (Left) as a surgical intervention .  The patient's history has been reviewed, patient examined, no change in status, stable for surgery.  I have reviewed the patient's chart and labs.  Questions were answered to the patient's satisfaction.     Agustina Witzke,STEVEN R

## 2015-10-07 NOTE — Progress Notes (Signed)
Pt with 3 loose BM's during night shift. [facility] on call notified. Awaiting call back with potential orders. This nurse will pass on to day shift RN. Nursing will continue to monitor.

## 2015-10-07 NOTE — Care Management Important Message (Signed)
Important Message  Patient Details  Name: Eileen Reeves MRN: [id number] Date of Birth: 1932/07/16   Medicare Important Message Given:  Yes    Melquiades Kovar P Deriana Vanderhoef 10/07/2015, 11:39 AM

## 2015-10-07 NOTE — Anesthesia Procedure Notes (Addendum)
Procedure Name: Intubation Date/Time: 10/07/2015 3:47 PM Performed by: Izora Gala Pre-anesthesia Checklist: Patient identified, Emergency Drugs available, Suction available and Patient being monitored Patient Re-evaluated:Patient Re-evaluated prior to inductionOxygen Delivery Method: Circle system utilized Preoxygenation: Pre-oxygenation with 100% oxygen Intubation Type: IV induction Ventilation: Mask ventilation without difficulty and Oral airway inserted - appropriate to patient size Laryngoscope Size: Miller and 3 Grade View: Grade I Tube type: Oral Tube size: 7.0 mm Number of attempts: 1 Airway Equipment and Method: Stylet Placement Confirmation: ETT inserted through vocal cords under direct vision,  positive ETCO2 and breath sounds checked- equal and bilateral Secured at: 21 cm Tube secured with: Tape Dental Injury: Teeth and Oropharynx as per pre-operative assessment    Anesthesia Regional Block:  Interscalene brachial plexus block  Pre-Anesthetic Checklist: ,, timeout performed, Correct Patient, Correct Site, Correct Laterality, Correct Procedure, Correct Position, site marked, Risks and benefits discussed,  Surgical consent,  Pre-op evaluation,  At surgeon's request and post-op pain management  Laterality: Left  Prep: chloraprep       Needles:  Injection technique: Single-shot  Needle Type: Echogenic Stimulator Needle     Needle Length: 9cm 9 cm Needle Gauge: 21 and 21 G    Additional Needles: Interscalene brachial plexus block Narrative:  Start time: 10/07/2015 6:00 PM End time: 10/07/2015 6:05 PM Injection made incrementally with aspirations every 5 mL.  Performed by: Personally   Additional Notes: 30 cc 0.5% Naropin injected easily

## 2015-10-07 NOTE — Discharge Instructions (Signed)
Minimal weight bearing with the left shoulder and arm.  May remove the sling as needed for ADLs and exercises  PT, OT daily for ADLS and mobilization  High Fall Risk  Keep the incision clean and dry and covered for one week, then ok to get wet in the shower  Follow up with Dr Veverly Fells in two weeks in the office  [phone]

## 2015-10-07 NOTE — Clinical Social Work Placement (Signed)
   CLINICAL SOCIAL WORK PLACEMENT  NOTE  Date:  10/07/2015  Patient Details  Name: Lylian Tyminski MRN: [id number] Date of Birth: 1932/06/24  Clinical Social Work is seeking post-discharge placement for this patient at the [place] level of care (*CSW will initial, date and re-position this form in  chart as items are completed):  Yes   Patient/family provided with [facility] Work Department's list of facilities offering this level of care within the geographic area requested by the patient (or if unable, by the patient's family).  Yes   Patient/family informed of their freedom to choose among providers that offer the needed level of care, that participate in Medicare, Medicaid or managed care program needed by the patient, have an available bed and are willing to accept the patient.  Yes   Patient/family informed of [facility]'s ownership interest in [facility] and [facility], as well as of the fact that they are under no obligation to receive care at these facilities.  PASRR submitted to EDS on 10/07/15     PASRR number received on 10/07/15     Existing PASRR number confirmed on       FL2 transmitted to all facilities in geographic area requested by pt/family on 10/07/15     FL2 transmitted to all facilities within larger geographic area on       Patient informed that his/her managed care company has contracts with or will negotiate with certain facilities, including the following:            Patient/family informed of bed offers received.  Patient chooses bed at       Physician recommends and patient chooses bed at      Patient to be transferred to   on  .  Patient to be transferred to facility by       Patient family notified on   of transfer.  Name of family member notified:        PHYSICIAN Please sign FL2     Additional Comment:    _______________________________________________ Cranford Mon, LCSW 10/07/2015,  4:44 PM

## 2015-10-07 NOTE — Transfer of Care (Signed)
Immediate Anesthesia Transfer of Care Note  Patient: Eileen Reeves  Procedure(s) Performed: Procedure(s): LEFT REVERSE SHOULDER ARTHROPLASTY (Left)  Patient Location: PACU  Anesthesia Type:General  Level of Consciousness: awake and alert   Airway & Oxygen Therapy: Patient connected to face mask oxygen  Post-op Assessment: Report given to RN, Post -op Vital signs reviewed and stable and Patient moving all extremities  Post vital signs: Reviewed and stable  Last Vitals:  Filed Vitals:   10/07/15 1152 10/07/15 1300  BP: 170/77 146/65  Pulse:  83  Temp:  36.8 C  Resp:  16    Complications: No apparent anesthesia complications

## 2015-10-07 NOTE — Progress Notes (Signed)
Patient received from OR C/O needing to go to the bathroom and in obvious discomfort; Adult diaper on and saturated with urine; bladder scan performed with 600 ml read; I&O cath performed per MD order and obtained 700 ml clear yellow urine with no odor; Patient stated relief post I&O cath.

## 2015-10-07 NOTE — Anesthesia Preprocedure Evaluation (Signed)
Anesthesia Evaluation  Patient identified by MRN, date of birth, ID band Patient awake    Reviewed: Allergy & Precautions, NPO status , Patient's Chart, lab work & pertinent test results  Airway Mallampati: II  TM Distance: >3 FB Neck ROM: Full    Dental  (+) Edentulous Upper   Pulmonary    breath sounds clear to auscultation       Cardiovascular  Rhythm:Regular Rate:Normal     Neuro/Psych    GI/Hepatic   Endo/Other    Renal/GU      Musculoskeletal   Abdominal   Peds  Hematology   Anesthesia Other Findings   Reproductive/Obstetrics                             Anesthesia Physical Anesthesia Plan  ASA: III  Anesthesia Plan: General   Post-op Pain Management: GA combined w/ Regional for post-op pain   Induction: Intravenous  Airway Management Planned: Oral ETT  Additional Equipment:   Intra-op Plan:   Post-operative Plan: Extubation in OR  Informed Consent: I have reviewed the patients History and Physical, chart, labs and discussed the procedure including the risks, benefits and alternatives for the proposed anesthesia with the patient or authorized representative who has indicated his/her understanding and acceptance.     Plan Discussed with: CRNA and Anesthesiologist  Anesthesia Plan Comments: (Fracture L. Proximal humerus Anxiety L. Bundle Branch Block of unknown duration, low risk factor profile, no chest pain, palpitations. Given lack of symptoms and need for fracture repair, will proceed with surgery.  Roberts Gaudy)        Anesthesia Quick Evaluation

## 2015-10-07 NOTE — H&P (Signed)
Eileen Reeves is an 81 y.o. female.    Chief Complaint: left shoulder pain  HPI: 82 y/o female had a ground level fall last week injuring left shoulder. Due to the amount of edema and ecchymosis in the shoulder surgery was postponed for 1 week. During that time patient had to be admitted for UTI infection and dizziness. She was transferred to [facility] for surgery and placed on antibiotics for her UTI. C/o moderate to severe pain in the left shoulder with inability to use left upper extremity in any useful manner. Will still plan for surgery to improve function and pain in the left upper extremity.  PCP:  Abigail Miyamoto, MD  PMH: Past Medical History  Diagnosis Date  . Dizziness     PSH: History reviewed. No pertinent past surgical history.  Social History:  reports that she has never smoked. She does not have any smokeless tobacco history on file. She reports that she does not drink alcohol. Her drug history is not on file.  Allergies:  Allergies  Allergen Reactions  . Tetanus Toxoids Anaphylaxis and Hives  . Penicillins Hives    Has patient had a PCN reaction causing immediate rash, facial/tongue/throat swelling, SOB or lightheadedness with hypotension: no Has patient had a PCN reaction causing severe rash involving mucus membranes or skin necrosis: no Has patient had a PCN reaction that required hospitalization no Has patient had a PCN reaction occurring within the last 10 years: no If all of the above answers are "NO", then may proceed with Cephalosporin use.  Marland Kitchen Phenobarbital Other (See Comments)    Reaction unknown    Medications: Current Facility-Administered Medications  Medication Dose Route Frequency Provider Last Rate Last Dose  . acetaminophen (TYLENOL) tablet 500 mg  500 mg Oral Q6H PRN Robbie Lis, MD   500 mg at 10/05/15 1608  . ALPRAZolam Duanne Moron) tablet 0.5 mg  0.5 mg Oral TID PRN Robbie Lis, MD   0.5 mg at 10/07/15 [id number]  . amLODipine (NORVASC) tablet 5 mg   5 mg Oral Daily Robbie Lis, MD   5 mg at 10/06/15 0947   And  . benazepril (LOTENSIN) tablet 10 mg  10 mg Oral Daily Robbie Lis, MD   10 mg at 10/06/15 0947  . aspirin chewable tablet 81 mg  81 mg Oral Daily Robbie Lis, MD   81 mg at 10/04/15 1400  . ceFAZolin (ANCEF) IVPB 1 g/50 mL premix  1 g Intravenous 3 times per day Venetia Maxon Rama, MD   1 g at 10/07/15 [id number]  . chlorhexidine (PERIDEX) 0.12 % solution 15 mL  15 mL Mouth/Throat BID Robbie Lis, MD   15 mL at 10/06/15 2340  . docusate sodium (COLACE) capsule 100 mg  100 mg Oral Q12H Robbie Lis, MD   100 mg at 10/06/15 2339  . feeding supplement (ENSURE ENLIVE) (ENSURE ENLIVE) liquid 237 mL  237 mL Oral BID BM Maricela Bo Ostheim, RD   237 mL at 10/06/15 0958  . hydrALAZINE (APRESOLINE) tablet 10 mg  10 mg Oral 4 times per day Robbie Lis, MD   10 mg at 10/07/15 [id number]  . hydrOXYzine (ATARAX/VISTARIL) tablet 10 mg  10 mg Oral TID Ambrose Finland, MD   10 mg at 10/06/15 2339  . latanoprost (XALATAN) 0.005 % ophthalmic solution 1 drop  1 drop Both Eyes QHS Robbie Lis, MD   1 drop at 10/06/15 1416  . ondansetron (ZOFRAN) tablet 4  mg  4 mg Oral Q6H PRN Robbie Lis, MD       Or  . ondansetron [facility]) injection 4 mg  4 mg Intravenous Q6H PRN Robbie Lis, MD      . polyethylene glycol [facility] / GLYCOLAX) packet 17 g  17 g Oral Daily Robbie Lis, MD   17 g at 10/06/15 0948  . polyvinyl alcohol (LIQUIFILM TEARS) 1.4 % ophthalmic solution 1 drop  1 drop Both Eyes BID Robbie Lis, MD   1 drop at 10/06/15 2340  . potassium chloride SA (K-DUR,KLOR-CON) CR tablet 20 mEq  20 mEq Oral BID Venetia Maxon Rama, MD   20 mEq at 10/06/15 2339  . pravastatin (PRAVACHOL) tablet 20 mg  20 mg Oral Daily Robbie Lis, MD   20 mg at 10/06/15 0947  . sodium phosphate (FLEET) 7-19 GM/118ML enema 1 enema  1 enema Rectal Daily PRN Venetia Maxon Rama, MD      . traMADol Veatrice Bourbon) tablet 50 mg  50 mg Oral Q6H PRN Robbie Lis, MD   50 mg at  10/06/15 2340  . venlafaxine XR (EFFEXOR-XR) 24 hr capsule 37.5 mg  37.5 mg Oral Q breakfast Ambrose Finland, MD   37.5 mg at 10/06/15 [id number]    Results for orders placed or performed during the hospital encounter of 10/04/15 (from the past 48 hour(s))  TSH     Status: None   Collection Time: 10/05/15 11:01 AM  Result Value Ref Range   TSH 1.151 0.350 - 4.500 uIU/mL  RPR     Status: None   Collection Time: 10/05/15 11:01 AM  Result Value Ref Range   RPR Ser Ql Non Reactive Non Reactive    Comment: (NOTE) Performed At: [facility] [place], [place] [id number] Lindon Romp MD [id number]   HIV antibody (routine testing) (NOT for [facility])     Status: None   Collection Time: 10/05/15 11:01 AM  Result Value Ref Range   HIV Screen 4th Generation wRfx Non Reactive Non Reactive    Comment: (NOTE) Performed At: [facility] [place], [place] [id number] Lindon Romp MD [id number]   Hepatic function panel     Status: Abnormal   Collection Time: 10/05/15 11:01 AM  Result Value Ref Range   Total Protein 6.7 6.5 - 8.1 g/dL   Albumin 3.4 (L) 3.5 - 5.0 g/dL   AST 16 15 - 41 U/L   ALT 11 (L) 14 - 54 U/L   Alkaline Phosphatase 48 38 - 126 U/L   Total Bilirubin 0.9 0.3 - 1.2 mg/dL   Bilirubin, Direct 0.2 0.1 - 0.5 mg/dL   Indirect Bilirubin 0.7 0.3 - 0.9 mg/dL  Glucose, capillary     Status: Abnormal   Collection Time: 10/06/15  8:20 AM  Result Value Ref Range   Glucose-Capillary 128 (H) 65 - 99 mg/dL   Comment 1 Notify RN   Glucose, capillary     Status: Abnormal   Collection Time: 10/07/15  7:36 AM  Result Value Ref Range   Glucose-Capillary 113 (H) 65 - 99 mg/dL   Comment 1 Notify RN    No results found.  ROS: ROS Recent UTI Recent dizziness Inability to use left upper extremity due to current fx  Physical Exam: Alert 81 y/o female with mild anxiety noted on exam Cervical spine with full rom and no  tenderness/deformity Left shoulder: moderate edema and ecchymosis extending down to left elbow  nv intact distally No other rashes seen No deformity noted to right upper or bilateral lower extremities  Physical Exam   Assessment/Plan Assessment: left proximal humerus fracture with displacement  Plan: Plan for surgical management of the left proximal humerus fracture and continued management of electrolyte abnormalities by medical team All risks and benefits of surgery discussed with pt and her daughter NPO

## 2015-10-08 ENCOUNTER — Encounter (HOSPITAL_COMMUNITY): Payer: Self-pay | Admitting: Orthopedic Surgery

## 2015-10-08 DIAGNOSIS — I10 Essential (primary) hypertension: Secondary | ICD-10-CM

## 2015-10-08 LAB — BASIC METABOLIC PANEL
ANION GAP: 7 (ref 5–15)
BUN: 9 mg/dL (ref 6–20)
CALCIUM: 8.5 mg/dL — AB (ref 8.9–10.3)
CO2: 31 mmol/L (ref 22–32)
CREATININE: 0.62 mg/dL (ref 0.44–1.00)
Chloride: 95 mmol/L — ABNORMAL LOW (ref 101–111)
Glucose, Bld: 87 mg/dL (ref 65–99)
Potassium: 4.6 mmol/L (ref 3.5–5.1)
SODIUM: 133 mmol/L — AB (ref 135–145)

## 2015-10-08 LAB — CBC
HEMATOCRIT: 34.5 % — AB (ref 36.0–46.0)
Hemoglobin: 11.1 g/dL — ABNORMAL LOW (ref 12.0–15.0)
MCH: 27.9 pg (ref 26.0–34.0)
MCHC: 32.2 g/dL (ref 30.0–36.0)
MCV: 86.7 fL (ref 78.0–100.0)
PLATELETS: 328 10*3/uL (ref 150–400)
RBC: 3.98 MIL/uL (ref 3.87–5.11)
RDW: 15.4 % (ref 11.5–15.5)
WBC: 9 10*3/uL (ref 4.0–10.5)

## 2015-10-08 LAB — VITAMIN B12: VITAMIN B 12: 172 pg/mL — AB (ref 180–914)

## 2015-10-08 LAB — GLUCOSE, CAPILLARY: Glucose-Capillary: 92 mg/dL (ref 65–99)

## 2015-10-08 MED ORDER — CEPHALEXIN 500 MG PO CAPS
500.0000 mg | ORAL_CAPSULE | Freq: Two times a day (BID) | ORAL | Status: DC
  Administered 2015-10-08 – 2015-10-09 (×3): 500 mg via ORAL
  Filled 2015-10-08 (×3): qty 1

## 2015-10-08 NOTE — Clinical Social Work Note (Signed)
Clinical Social Worker has confirmed that patient has a bed at [facility] [place] [facility] [place]. Pt's dtr will complete paperwork today with admissions coordinator.   CSW remains available as needed.   Glendon Axe, MSW, LCSWA [phone] 10/08/2015 10:42 AM

## 2015-10-08 NOTE — Progress Notes (Signed)
Patient ID: Eileen Reeves, female   DOB: [birth year], 81 y.o.   MRN: [id number]  [facility] PROGRESS NOTE  Eileen Reeves [id number] DOB: 03/24/1932 DOA: 11-Oct-2015 PCP: Eileen Miyamoto, MD  Brief narrative:    Pt is 81 yo female who presented initially to [facility] ED with dizziness and lightheadedness and was found to have UTI and left should fracture. She was transferred to [facility] for surgical evaluation.   Assessment/Plan:    1.  Adjustment disorder with mixed anxiety and depressed mood - psychiatry following, appreciate input  - Started on hydroxyzine scheduled as well as Effexor XR 37.5 mg daily for depression and anxiety.  2.  UTI - E. Coli pan sensitive - Ancef started 10/11/15, change to Keflex today 12/09  3.  Left shoulder fracture,proximal humerus fracture, displaced  - s/p fall on 09/28/2015 - s/p left reverse shoulder arthroplasty, post op day #1 - with some pot op urinary retention - foley placed  - monitor and try voiding trial in AM - plan to d/c SNF in 24 hours if bed available   4.  Chronic constipation - daily MiraLAX and colace   5.  Chronic paraesthesia bilaterally - RPR nonreactive, TSH normal. Check 0000000 and folic acid  6.  Dizziness - currently resolved. - MRI brain negative. - PT eval requested   7.  Protein malnutrition - nutritionist consulted   8.  Accelerated HTN - SBP still in 170's - currently on benazepril, hydralazine, Norvasc - will add hydralazine as needed for better BP control   9.  Hypokalemia - supplement and repeat BMP in AM  DVT prophylaxis - Heparin SQ  Code Status: Full.  Family Communication:  plan of care discussed with the patient, no family at bedside  Disposition Plan: SNF by 12/10  IV access:  Peripheral IV  Procedures and diagnostic studies:    Mr Brain Wo Contrast 10-11-2015  Exam is motion degraded. No acute infarct. Prominent small vessel disease type changes. Global atrophy without  hydrocephalus. Thickening anterior falx may be related to ossification rather than meningioma or hemorrhage.   Ct Shoulder Left Wo Contrast 09/28/2015  Severely comminuted and complex fracture involving the humeral head neck. Multipart fracture with a head split. The largest component of the articular surface is rotated posteriorly and not articulating with the glenoid. Please see 3D images. Multiple small fracture fragments in the joint. Possible small fracture involving the superior margin of the glenoid posteriorly.   Dg Shoulder Left 09/28/2015  Impacted fracture of the left humeral head. Fracture of the left humeral neck. Consider CT for further anatomic evaluation.   Medical Consultants:  Psych Ortho    Other Consultants:  PT/OT - SNF recommended   IAnti-Infectives:   None   Eileen Ramsay, MD  [facility] Pager [phone]  If 7PM-7AM, please contact night-coverage [email or web address] Password [email or web address] 10/08/2015, 4:02 PM   LOS: 4 days   HPI/Subjective: No events overnight.   Objective: Filed Vitals:   10/07/15 1950 10/07/15 2000 10/08/15 0005 10/08/15 0504  BP: 115/50  110/55 126/53  Pulse: 87  87 85  Temp: 97 F (36.1 C)  97.6 F (36.4 C) 97.9 F (36.6 C)  TempSrc: Oral  Oral Oral  Resp: 20  18 18   Height:      Weight:    61.8 kg (136 lb 3.9 oz)  SpO2: 89% 95% 92% 95%    Intake/Output Summary (Last 24 hours) at 10/08/15 1602 Last data filed at 10/08/15 1256  Gross per 24 hour  Intake   1685 ml  Output   1600 ml  Net     85 ml    Exam:   General:  Pt is alert, follows commands appropriately, not in acute distress  Cardiovascular: Regular rate and rhythm, no rubs, no gallops  Respiratory: Clear to auscultation bilaterally, no wheezing, no crackles, no rhonchi  Abdomen: Soft, non tender, non distended, bowel sounds present, no guarding  Data Reviewed: Basic Metabolic Panel:  Recent Labs Lab 10/04/15 0747 10/04/15 1328 10/05/15 0547 10/08/15 0530  NA  138 137 138 133*  K 3.1* 3.2* 3.3* 4.6  CL 95* 96* 99* 95*  CO2 32 32 29 31  GLUCOSE 114* 127* 125* 87  BUN 8 7 10 9   CREATININE 0.54 0.65 0.58 0.62  CALCIUM 9.1 8.8* 9.2 8.5*  MG  --  1.9  --   --   PHOS  --  2.6  --   --    Liver Function Tests:  Recent Labs Lab 10/05/15 1101  AST 16  ALT 11*  ALKPHOS 48  BILITOT 0.9  PROT 6.7  ALBUMIN 3.4*   CBC:  Recent Labs Lab 10/04/15 0747 10/05/15 0547 10/08/15 0530  WBC 7.7 9.0 9.0  NEUTROABS 6.1  --   --   HGB 12.8 13.0 11.1*  HCT 38.2 39.4 34.5*  MCV 84.9 85.3 86.7  PLT 293 325 328   CBG:  Recent Labs Lab 10/05/15 0741 10/06/15 0820 10/07/15 0736 10/08/15 0630  GLUCAP 125* 128* 113* 92    Recent Results (from the past 240 hour(s))  Urine culture     Status: None   Collection Time: 10/04/15  6:58 AM  Result Value Ref Range Status   Specimen Description URINE, RANDOM  Final   Special Requests NONE  Final   Culture   Final    >=100,000 COLONIES/mL ESCHERICHIA COLI Performed at [facility]    Report Status 10/06/2015 FINAL  Final   Organism ID, Bacteria ESCHERICHIA COLI  Final      Susceptibility   Escherichia coli - MIC*    AMPICILLIN <=2 SENSITIVE Sensitive     CEFAZOLIN <=4 SENSITIVE Sensitive     CEFTRIAXONE <=1 SENSITIVE Sensitive     CIPROFLOXACIN <=0.25 SENSITIVE Sensitive     GENTAMICIN <=1 SENSITIVE Sensitive     IMIPENEM <=0.25 SENSITIVE Sensitive     NITROFURANTOIN <=16 SENSITIVE Sensitive     TRIMETH/SULFA <=20 SENSITIVE Sensitive     AMPICILLIN/SULBACTAM <=2 SENSITIVE Sensitive     PIP/TAZO <=4 SENSITIVE Sensitive     * >=100,000 COLONIES/mL ESCHERICHIA COLI   Scheduled Meds:   . amLODipine  5 mg Oral Daily  . benazepril  10 mg Oral Daily  . aspirin  81 mg Oral Daily  . ceFAZolin  IV  1 g Intravenous 3 times per day  . hydrALAZINE  10 mg Oral 4 times per day  . hydrOXYzine  10 mg Oral TID  . pravastatin  20 mg Oral Daily  . venlafaxine XR  37.5 mg Oral Q breakfast

## 2015-10-08 NOTE — Progress Notes (Signed)
Anesthesiology Follow-up:  Awake and alert, appears comfortable, complaining of mild shoulder discomfort and frequent urination.  VS: T-36.4 BP- 110/55 HR 87 RR-18 O2 Sat 95% on 2L [place]  Telemetry monitor shows Sr with frequent PVCs.  Impression:  81 year old female with LBBB and frequent PVCs stable overnight following L. Reverse shoulder arthroplasty for proximal humerus fracture. No apparent complications.  Roberts Gaudy

## 2015-10-08 NOTE — Progress Notes (Signed)
Orthopedics Progress Note  Patient seen on rounds at 0745 am  Subjective: Patient reports some pain in the left shoulder this morning.   Objective:  Filed Vitals:   10/08/15 0005 10/08/15 0504  BP: 110/55 126/53  Pulse: 87 85  Temp: 97.6 F (36.4 C) 97.9 F (36.6 C)  Resp: 18 18    General: Awake and alert  Musculoskeletal: left shoulder dressing CDI, NVI Neurovascularly intact  Lab Results  Component Value Date   WBC 9.0 10/08/2015   HGB 11.1* 10/08/2015   HCT 34.5* 10/08/2015   MCV 86.7 10/08/2015   PLT 328 10/08/2015       Component Value Date/Time   NA 133* 10/08/2015 0530   K 4.6 10/08/2015 0530   CL 95* 10/08/2015 0530   CO2 31 10/08/2015 0530   GLUCOSE 87 10/08/2015 0530   BUN 9 10/08/2015 0530   CREATININE 0.62 10/08/2015 0530   CALCIUM 8.5* 10/08/2015 0530   GFRNONAA >60 10/08/2015 0530   GFRAA >60 10/08/2015 0530    No results found for: INR, PROTIME  Assessment/Plan: POD #1 s/p Procedure(s): LEFT REVERSE SHOULDER ARTHROPLASTY Patient doing very well at this point.  Will need SNF rehab. Very High fall risk UTI - on po Kelfex per internal medicine Continue OT. PT, mobilization, mechanical DVT prophylaxis  Doran Heater. Veverly Fells, MD 10/08/2015 4:20 PM

## 2015-10-08 NOTE — Progress Notes (Signed)
Patient was in and out cath once in PACU post op got out 745ml. Has not been able to void except for a few drops and frequently calling out for bathroom use. Bladder scan done showed ~456ml. Second in and out cath done at 0200am got out 534ml of urine. Patient now resting. Will continue to monitor.

## 2015-10-08 NOTE — Progress Notes (Addendum)
Physical Therapy Treatment Patient Details Name: Eileen Reeves MRN: [id number] DOB: 1931/12/20 Today's Date: 10/08/2015    History of Present Illness s/p fall 11/29 with L humerus fx. underwent reverse TSA 12/08.     PT Comments    Pt admitted with above diagnosis. Pt currently with functional limitations due to balance and endurance deficits as well as weight bearing restrictions left UE now.  Will need SNF to progress to I when able.  Continue with  same goals set on 10/05/15.  Will follow acutely.  Pt will benefit from skilled PT to increase their independence and safety with mobility to allow discharge to the venue listed below.    Follow Up Recommendations  SNF     Equipment Recommendations  Other (comment) (to be assessed)    Recommendations for Other Services       Precautions / Restrictions Precautions Precautions: Fall;Shoulder Type of Shoulder Precautions: conservative - active elbow/wrist/hand. ROM with ADL focus Precaution Booklet Issued: Yes (comment) Required Braces or Orthoses: Sling (at all times with exception of ADL and exercise) Restrictions Weight Bearing Restrictions: Yes LUE Weight Bearing:  (weight bearing for balance only)    Mobility  Bed Mobility Overal bed mobility: Needs Assistance Bed Mobility: Supine to Sit     Supine to sit: Min assist;HOB elevated        Transfers Overall transfer level: Needs assistance Equipment used: 1 person hand held assist Transfers: Sit to/from Stand Sit to Stand: Min assist         General transfer comment: mod A with mobility with HHA  Ambulation/Gait Ambulation/Gait assistance: Mod assist Ambulation Distance (Feet): 20 Feet Assistive device: 1 person hand held assist Gait Pattern/deviations: Step-through pattern;Decreased stride length;Narrow base of support;Leaning posteriorly   Gait velocity interpretation: Below normal speed for age/gender General Gait Details: pt reports fear of falling,  denies dizziness, HHA for pt to steady however required occasional external assist for balance at times, again very concerned with falling, significant posterior lean.  Very guarded gait.    Stairs            Wheelchair Mobility    Modified Rankin (Stroke Patients Only)       Balance Overall balance assessment: Needs assistance;History of Falls Sitting-balance support: No upper extremity supported;Feet supported Sitting balance-Leahy Scale: Fair     Standing balance support: Single extremity supported;During functional activity Standing balance-Leahy Scale: Poor Standing balance comment: cannot balance unless has single UE support and min to mod assist.  Poor postural awarness.                     Cognition Arousal/Alertness: Awake/alert Behavior During Therapy: Anxious;Flat affect Overall Cognitive Status: No family/caregiver present to determine baseline cognitive functioning                      Exercises Donning/doffing shirt without moving shoulder: Maximal assistance Method for sponge bathing under operated UE: Maximal assistance Donning/doffing sling/immobilizer: Maximal assistance Correct positioning of sling/immobilizer: Maximal assistance ROM for elbow, wrist and digits of operated UE: Moderate assistance Sling wearing schedule (on at all times/off for ADL's): Maximal assistance Proper positioning of operated UE when showering: Maximal assistance    General Comments        Pertinent Vitals/Pain Pain Assessment: 0-10 Pain Score: 7  Pain Location: left shoulder Pain Descriptors / Indicators: Aching;Constant Pain Intervention(s): Limited activity within patient's tolerance;Monitored during session;Premedicated before session;Repositioned;Ice applied  VSS    Home Living Family/patient expects to  be discharged to:: Skilled nursing facility                    Prior Function Level of Independence: Independent          PT Goals  (current goals can now be found in the care plan section) Acute Rehab PT Goals Patient Stated Goal: to not be in pain PT Goal Formulation: With patient/family Time For Goal Achievement: 10/22/15 Potential to Achieve Goals: Fair Progress towards PT goals: Progressing toward goals    Frequency  Min 2X/week    PT Plan Current plan remains appropriate    Co-evaluation             End of Session Equipment Utilized During Treatment: Gait belt;Other (comment) (L UE remained in sling) Activity Tolerance: Other (comment) (limited by fear of falling) Patient left: in chair;with call bell/phone within reach     Time: 1150-1204 PT Time Calculation (min) (ACUTE ONLY): 14 min  Charges:                       G CodesDenice Paradise Oct 19, 2015, 1:59 PM  [place] K[place]on Skillen,PT Acute Rehabilitation [phone] (pager)

## 2015-10-08 NOTE — Addendum Note (Signed)
Addendum  created 10/08/15 [id number] by Roberts Gaudy, MD   Modules edited: Notes Section   Notes Section:  File: [id number]; [place]: [id number]

## 2015-10-09 DIAGNOSIS — K5900 Constipation, unspecified: Secondary | ICD-10-CM | POA: Diagnosis not present

## 2015-10-09 DIAGNOSIS — F329 Major depressive disorder, single episode, unspecified: Secondary | ICD-10-CM | POA: Diagnosis not present

## 2015-10-09 DIAGNOSIS — E43 Unspecified severe protein-calorie malnutrition: Secondary | ICD-10-CM | POA: Diagnosis not present

## 2015-10-09 DIAGNOSIS — S42272D Torus fracture of upper end of left humerus, subsequent encounter for fracture with routine healing: Secondary | ICD-10-CM | POA: Diagnosis not present

## 2015-10-09 DIAGNOSIS — S42202D Unspecified fracture of upper end of left humerus, subsequent encounter for fracture with routine healing: Secondary | ICD-10-CM | POA: Diagnosis not present

## 2015-10-09 DIAGNOSIS — R42 Dizziness and giddiness: Secondary | ICD-10-CM | POA: Diagnosis not present

## 2015-10-09 DIAGNOSIS — E785 Hyperlipidemia, unspecified: Secondary | ICD-10-CM | POA: Diagnosis not present

## 2015-10-09 DIAGNOSIS — R259 Unspecified abnormal involuntary movements: Secondary | ICD-10-CM | POA: Diagnosis not present

## 2015-10-09 DIAGNOSIS — Z96612 Presence of left artificial shoulder joint: Secondary | ICD-10-CM | POA: Diagnosis not present

## 2015-10-09 DIAGNOSIS — F418 Other specified anxiety disorders: Secondary | ICD-10-CM | POA: Diagnosis not present

## 2015-10-09 DIAGNOSIS — F4323 Adjustment disorder with mixed anxiety and depressed mood: Secondary | ICD-10-CM | POA: Diagnosis not present

## 2015-10-09 DIAGNOSIS — M84312S Stress fracture, left shoulder, sequela: Secondary | ICD-10-CM | POA: Diagnosis not present

## 2015-10-09 DIAGNOSIS — Z9181 History of falling: Secondary | ICD-10-CM | POA: Diagnosis not present

## 2015-10-09 DIAGNOSIS — K5909 Other constipation: Secondary | ICD-10-CM | POA: Diagnosis not present

## 2015-10-09 DIAGNOSIS — I10 Essential (primary) hypertension: Secondary | ICD-10-CM | POA: Diagnosis not present

## 2015-10-09 DIAGNOSIS — M79602 Pain in left arm: Secondary | ICD-10-CM | POA: Diagnosis not present

## 2015-10-09 DIAGNOSIS — Z4789 Encounter for other orthopedic aftercare: Secondary | ICD-10-CM | POA: Diagnosis not present

## 2015-10-09 DIAGNOSIS — F419 Anxiety disorder, unspecified: Secondary | ICD-10-CM | POA: Diagnosis not present

## 2015-10-09 DIAGNOSIS — R2689 Other abnormalities of gait and mobility: Secondary | ICD-10-CM | POA: Diagnosis not present

## 2015-10-09 DIAGNOSIS — M25512 Pain in left shoulder: Secondary | ICD-10-CM | POA: Diagnosis not present

## 2015-10-09 DIAGNOSIS — Z87440 Personal history of urinary (tract) infections: Secondary | ICD-10-CM | POA: Diagnosis not present

## 2015-10-09 DIAGNOSIS — E876 Hypokalemia: Secondary | ICD-10-CM | POA: Diagnosis not present

## 2015-10-09 LAB — BASIC METABOLIC PANEL WITH GFR
Anion gap: 7 (ref 5–15)
BUN: 10 mg/dL (ref 6–20)
CO2: 31 mmol/L (ref 22–32)
Calcium: 8.5 mg/dL — ABNORMAL LOW (ref 8.9–10.3)
Chloride: 94 mmol/L — ABNORMAL LOW (ref 101–111)
Creatinine, Ser: 0.47 mg/dL (ref 0.44–1.00)
GFR calc Af Amer: >60 mL/min
GFR calc non Af Amer: >60 mL/min
Glucose, Bld: 114 mg/dL — ABNORMAL HIGH (ref 65–99)
Potassium: 4.9 mmol/L (ref 3.5–5.1)
Sodium: 132 mmol/L — ABNORMAL LOW (ref 135–145)

## 2015-10-09 LAB — CBC
HCT: 32.3 % — ABNORMAL LOW (ref 36.0–46.0)
Hemoglobin: 10.7 g/dL — ABNORMAL LOW (ref 12.0–15.0)
MCH: 28.5 pg (ref 26.0–34.0)
MCHC: 33.1 g/dL (ref 30.0–36.0)
MCV: 86.1 fL (ref 78.0–100.0)
Platelets: 271 K/uL (ref 150–400)
RBC: 3.75 MIL/uL — ABNORMAL LOW (ref 3.87–5.11)
RDW: 15.3 % (ref 11.5–15.5)
WBC: 11.2 K/uL — ABNORMAL HIGH (ref 4.0–10.5)

## 2015-10-09 MED ORDER — HYDROCODONE-ACETAMINOPHEN 5-325 MG PO TABS
1.0000 | ORAL_TABLET | ORAL | Status: DC | PRN
  Administered 2015-10-09 (×2): 2 via ORAL
  Filled 2015-10-09 (×2): qty 2

## 2015-10-09 MED ORDER — TRAMADOL HCL 50 MG PO TABS: 50.0000 mg | ORAL_TABLET | Freq: Four times a day (QID) | ORAL | Status: AC | PRN

## 2015-10-09 MED ORDER — VENLAFAXINE HCL ER 37.5 MG PO CP24: 37.5000 mg | ORAL_CAPSULE | Freq: Every day | ORAL | Status: AC

## 2015-10-09 MED ORDER — HYDROXYZINE HCL 10 MG PO TABS: 10.0000 mg | ORAL_TABLET | Freq: Three times a day (TID) | ORAL | Status: AC

## 2015-10-09 MED ORDER — BISACODYL 10 MG RE SUPP: 10.0000 mg | Freq: Every day | RECTAL | Status: AC | PRN

## 2015-10-09 MED ORDER — ALPRAZOLAM 0.5 MG PO TABS: 0.5000 mg | ORAL_TABLET | Freq: Three times a day (TID) | ORAL | Status: AC | PRN

## 2015-10-09 MED ORDER — CEPHALEXIN 500 MG PO CAPS: 500.0000 mg | ORAL_CAPSULE | Freq: Two times a day (BID) | ORAL | Status: AC

## 2015-10-09 MED ORDER — HYDROCODONE-ACETAMINOPHEN 5-325 MG PO TABS: 1.0000 | ORAL_TABLET | Freq: Four times a day (QID) | ORAL | Status: AC | PRN

## 2015-10-09 NOTE — Progress Notes (Signed)
Per RN, Pt ready for d/c.  Notified [facility] who stated that they don't have information for Pt to be admitted over the weekend. They will look into this further and call SW back.  Notified Pt's daughter who was unaware that Pt will d/c today. She stated that she signed the paperwork at [facility] and that it was agreed that Pt would be ready on Monday. Pt's daughter to come to the hospital and speak with staff shortly.  Bernita Raisin, [place] Social Work [phone]

## 2015-10-09 NOTE — Progress Notes (Signed)
Eileen Reeves  MRN: [id number] DOB/Age: 1931-12-22 81 y.o. Physician: Ander Slade, M.D. 2 Days Post-Op Procedure(s) (LRB): LEFT REVERSE SHOULDER ARTHROPLASTY (Left)  Subjective: Sitting on bedside commode, no new c/o. Reports mild shoulder pain Vital Signs Temp:  [97.6 F (36.4 C)-98.3 F (36.8 C)] 98.3 F (36.8 C) (12/10 0623) Pulse Rate:  [68-98] 68 (12/10 0623) Resp:  [16-19] 16 (12/10 0623) BP: (119-156)/(46-48) 156/48 mmHg (12/10 0623) SpO2:  [90 %-91 %] 91 % (12/10 0623)  Lab Results  Recent Labs  10/08/15 0530 10/09/15 0317  WBC 9.0 11.2*  HGB 11.1* 10.7*  HCT 34.5* 32.3*  PLT 328 271   BMET  Recent Labs  10/08/15 0530 10/09/15 0317  NA 133* 132*  K 4.6 4.9  CL 95* 94*  CO2 31 31  GLUCOSE 87 114*  BUN 9 10  CREATININE 0.62 0.47  CALCIUM 8.5* 8.5*   No results found for: INR   Exam  Dressings dry L shoulder, excellent digital motion  Plan Advance per Dr. Veverly Fells post op plan.  Woodard Perrell M Adie Vilar 10/09/2015, 10:13 AM    Contact # [phone]

## 2015-10-09 NOTE — Discharge Summary (Addendum)
Physician Discharge Summary  Signa Lempka [id number] DOB: 1932/05/07 DOA: 10/04/2015  PCP: Abigail Miyamoto, MD  Admit date: 10/04/2015 Discharge date: 10/09/2015  Recommendations for Outpatient Follow-up:  1. Pt will need to follow up with PCP in 2-3 weeks post discharge 2. Please obtain BMP to evaluate electrolytes and kidney function 3. Please note that pt was started on Effexor and hydroxyzine per psychiatry recommendations 4. Please note that pt has been uncooperative and at times refusing some medications, psychiatrist recommended above mentioned meds so follow up with psychiatrist is strongly recommended   Discharge Diagnoses:  Principal Problem:   Adjustment disorder with mixed anxiety and depressed mood Active Problems:   Dizziness   UTI (lower urinary tract infection)   Benign essential HTN   Hypokalemia   Dyslipidemia   Paresthesia of both hands rule out neuropathy   Chronic constipation   Protein-calorie malnutrition, severe   Shoulder fracture, left   S/P shoulder replacement  Discharge Condition: Stable  Diet recommendation: Heart healthy diet discussed in details   Brief narrative:    Pt is 81 yo female who presented initially to [facility] ED with dizziness and lightheadedness and was found to have UTI and left should fracture. She was transferred to [facility] for surgical evaluation.   Assessment/Plan:    1. Adjustment disorder with mixed anxiety and depressed mood - psychiatry recommended outpatient follow up - Started on hydroxyzine scheduled as well as Effexor XR 37.5 mg daily for depression and anxiety.  2. UTI - E. Coli pan sensitive - continue Keflex upon discharge to complete therapy   3. Left shoulder fracture,proximal humerus fracture, displaced  - s/p fall on 09/28/2015 - s/p left reverse shoulder arthroplasty, post op day #2 - plan to d/c SNF   4. Chronic constipation - continue bowel regimen   5. Chronic paraesthesia bilaterally -  RPR nonreactive, TSH normal  6. Dizziness - currently resolved. - MRI brain negative.  7. Protein malnutrition - nutritionist consulted   8. Accelerated HTN - currently on Norvasc, reasonably stable   9. Hypokalemia - supplemented  Code Status: Full.  Family Communication: plan of care discussed with the patient, no family at bedside  Disposition Plan: SNF   IV access:  Peripheral IV  Procedures and diagnostic studies:   Mr Brain Wo Contrast 10/04/2015 Exam is motion degraded. No acute infarct. Prominent small vessel disease type changes. Global atrophy without hydrocephalus. Thickening anterior falx may be related to ossification rather than meningioma or hemorrhage.   Ct Shoulder Left Wo Contrast 09/28/2015 Severely comminuted and complex fracture involving the humeral head neck. Multipart fracture with a head split. The largest component of the articular surface is rotated posteriorly and not articulating with the glenoid. Please see 3D images. Multiple small fracture fragments in the joint. Possible small fracture involving the superior margin of the glenoid posteriorly.   Dg Shoulder Left 09/28/2015 Impacted fracture of the left humeral head. Fracture of the left humeral neck. Consider CT for further anatomic evaluation.   Medical Consultants:  Psych Ortho   Other Consultants:  PT/OT - SNF recommended   IAnti-Infectives:   None      Discharge Exam: Filed Vitals:   10/08/15 2052 10/09/15 0623  BP: 119/46 156/48  Pulse: 98 68  Temp: 97.6 F (36.4 C) 98.3 F (36.8 C)  Resp: 19 16   Filed Vitals:   10/08/15 0005 10/08/15 0504 10/08/15 2052 10/09/15 0623  BP: 110/55 126/53 119/46 156/48  Pulse: 87 85 98 68  Temp:  97.6 F (36.4 C) 97.9 F (36.6 C) 97.6 F (36.4 C) 98.3 F (36.8 C)  TempSrc: Oral Oral Oral Oral  Resp: 18 18 19 16   Height:      Weight:  61.8 kg (136 lb 3.9 oz)    SpO2: 92% 95% 90% 91%    General: Pt is  alert, follows commands appropriately, not in acute distress Cardiovascular: Regular rate and rhythm, no rubs, no gallops Respiratory: Clear to auscultation bilaterally, no wheezing, no crackles, no rhonchi Abdominal: Soft, non tender, non distended, bowel sounds +, no guarding  Discharge Instructions  Discharge Instructions    Partial weight bearing    Complete by:  As directed   25% body weight, with supervision and with walker  % Body Weight:  minimal, just for balance  Laterality:  left  Extremity:  Upper            Medication List    STOP taking these medications        clorazepate 7.5 MG tablet  Commonly known as:  TRANXENE      TAKE these medications        acetaminophen 500 MG tablet  Commonly known as:  TYLENOL  Take 500 mg by mouth every 6 (six) hours as needed (For pain.).     ALPRAZolam 0.5 MG tablet  Commonly known as:  XANAX  Take 1 tablet (0.5 mg total) by mouth 3 (three) times daily as needed for anxiety.     amLODipine-benazepril 5-10 MG capsule  Commonly known as:  LOTREL  Take 1 capsule by mouth daily.     aspirin 81 MG tablet  Take 81 mg by mouth daily.     bisacodyl 10 MG suppository  Commonly known as:  DULCOLAX  Place 1 suppository (10 mg total) rectally daily as needed for moderate constipation.     cephALEXin 500 MG capsule  Commonly known as:  KEFLEX  Take 1 capsule (500 mg total) by mouth every 12 (twelve) hours.     docusate sodium 100 MG capsule  Commonly known as:  COLACE  Take 1 capsule (100 mg total) by mouth every 12 (twelve) hours.     HYDROcodone-acetaminophen 5-325 MG tablet  Commonly known as:  NORCO  Take 1 tablet by mouth every 6 (six) hours as needed for moderate pain.     hydrOXYzine 10 MG tablet  Commonly known as:  ATARAX/VISTARIL  Take 1 tablet (10 mg total) by mouth 3 (three) times daily.     LUMIGAN 0.01 % Soln  Generic drug:  bimatoprost  Place 1 drop into both eyes daily at 2 PM.     polyethylene glycol  packet  Commonly known as:  MIRALAX / GLYCOLAX  Take 17 g by mouth daily.     pravastatin 20 MG tablet  Commonly known as:  PRAVACHOL  Take 20 mg by mouth daily.     REFRESH OP  Place 1 drop into both eyes 3 (three) times daily.     traMADol 50 MG tablet  Commonly known as:  ULTRAM  Take 1 tablet (50 mg total) by mouth every 6 (six) hours as needed.     venlafaxine XR 37.5 MG 24 hr capsule  Commonly known as:  EFFEXOR-XR  Take 1 capsule (37.5 mg total) by mouth daily with breakfast.           Follow-up Information    Follow up with NORRIS,STEVEN R, MD. Call in 2 weeks.   Specialty:  Orthopedic Surgery  Why:  [phone]   Contact information:   [address] [place] [address] [phone]       Follow up with Abigail Miyamoto, MD.   Specialty:  Family Medicine   Contact information:   [place] [address] [phone]        The results of significant diagnostics from this hospitalization (including imaging, microbiology, ancillary and laboratory) are listed below for reference.     Microbiology: Recent Results (from the past 240 hour(s))  Urine culture     Status: None   Collection Time: 10/04/15  6:58 AM  Result Value Ref Range Status   Specimen Description URINE, RANDOM  Final   Special Requests NONE  Final   Culture   Final    >=100,000 COLONIES/mL ESCHERICHIA COLI Performed at [facility]    Report Status 10/06/2015 FINAL  Final   Organism ID, Bacteria ESCHERICHIA COLI  Final      Susceptibility   Escherichia coli - MIC*    AMPICILLIN <=2 SENSITIVE Sensitive     CEFAZOLIN <=4 SENSITIVE Sensitive     CEFTRIAXONE <=1 SENSITIVE Sensitive     CIPROFLOXACIN <=0.25 SENSITIVE Sensitive     GENTAMICIN <=1 SENSITIVE Sensitive     IMIPENEM <=0.25 SENSITIVE Sensitive     NITROFURANTOIN <=16 SENSITIVE Sensitive     TRIMETH/SULFA <=20 SENSITIVE Sensitive     AMPICILLIN/SULBACTAM <=2 SENSITIVE Sensitive      PIP/TAZO <=4 SENSITIVE Sensitive     * >=100,000 COLONIES/mL ESCHERICHIA COLI     Labs: Basic Metabolic Panel:  Recent Labs Lab 10/04/15 0747 10/04/15 1328 10/05/15 0547 10/08/15 0530 10/09/15 0317  NA 138 137 138 133* 132*  K 3.1* 3.2* 3.3* 4.6 4.9  CL 95* 96* 99* 95* 94*  CO2 32 32 29 31 31   GLUCOSE 114* 127* 125* 87 114*  BUN 8 7 10 9 10   CREATININE 0.54 0.65 0.58 0.62 0.47  CALCIUM 9.1 8.8* 9.2 8.5* 8.5*  MG  --  1.9  --   --   --   PHOS  --  2.6  --   --   --    Liver Function Tests:  Recent Labs Lab 10/05/15 1101  AST 16  ALT 11*  ALKPHOS 48  BILITOT 0.9  PROT 6.7  ALBUMIN 3.4*   CBC:  Recent Labs Lab 10/04/15 0747 10/05/15 0547 10/08/15 0530 10/09/15 0317  WBC 7.7 9.0 9.0 11.2*  NEUTROABS 6.1  --   --   --   HGB 12.8 13.0 11.1* 10.7*  HCT 38.2 39.4 34.5* 32.3*  MCV 84.9 85.3 86.7 86.1  PLT 293 325 328 271   CBG:  Recent Labs Lab 10/05/15 0741 10/06/15 0820 10/07/15 0736 10/08/15 0630  GLUCAP 125* 128* 113* 92    SIGNED: Time coordinating discharge: 30 minutes  MAGICK-MYERS, ISKRA, MD  [facility] 10/09/2015, 8:55 AM Pager [phone]  If 7PM-7AM, please contact night-coverage [email or web address] Password [email or web address]

## 2015-10-09 NOTE — Clinical Social Work Placement (Signed)
   CLINICAL SOCIAL WORK PLACEMENT  NOTE  Date:  10/09/2015  Patient Details  Name: Eileen Reeves MRN: [id number] Date of Birth: 1932-04-25  Clinical Social Work is seeking post-discharge placement for this patient at the [place] level of care (*CSW will initial, date and re-position this form in  chart as items are completed):  Yes   Patient/family provided with [place] Work Department's list of facilities offering this level of care within the geographic area requested by the patient (or if unable, by the patient's family).  Yes   Patient/family informed of their freedom to choose among providers that offer the needed level of care, that participate in Medicare, Medicaid or managed care program needed by the patient, have an available bed and are willing to accept the patient.  Yes   Patient/family informed of [place]'s ownership interest in [facility] and [facility], as well as of the fact that they are under no obligation to receive care at these facilities.  PASRR submitted to EDS on 10/07/15     PASRR number received on 10/07/15     Existing PASRR number confirmed on       FL2 transmitted to all facilities in geographic area requested by pt/family on 10/07/15     FL2 transmitted to all facilities within larger geographic area on       Patient informed that his/her managed care company has contracts with or will negotiate with certain facilities, including the following:        Yes   Patient/family informed of bed offers received.  Patient chooses bed at [facility] recommends and patient chooses bed at      Patient to be transferred to [facility] on 10/09/15.  Patient to be transferred to facility by EMS     Patient family notified on 10/09/15 of transfer.  Name of family member notified:  Daughter, Margaretha Sheffield     PHYSICIAN Please sign FL2     Additional Comment:     _______________________________________________ Matilde Bash, LCSW 10/09/2015, 2:39 PM

## 2015-10-09 NOTE — Progress Notes (Signed)
Facility ready to received Pt.   Pt and daughter aware.  RN to call report.  SW to arrange for transportation.  Pt to be d/c'd.  Bernita Raisin, [place] Social Work [phone]

## 2015-10-11 DIAGNOSIS — M25512 Pain in left shoulder: Secondary | ICD-10-CM | POA: Diagnosis not present

## 2015-10-11 DIAGNOSIS — E785 Hyperlipidemia, unspecified: Secondary | ICD-10-CM | POA: Diagnosis not present

## 2015-10-11 DIAGNOSIS — K5900 Constipation, unspecified: Secondary | ICD-10-CM | POA: Diagnosis not present

## 2015-10-11 DIAGNOSIS — R2689 Other abnormalities of gait and mobility: Secondary | ICD-10-CM | POA: Diagnosis not present

## 2015-10-11 DIAGNOSIS — I10 Essential (primary) hypertension: Secondary | ICD-10-CM | POA: Diagnosis not present

## 2015-10-11 DIAGNOSIS — M84312S Stress fracture, left shoulder, sequela: Secondary | ICD-10-CM | POA: Diagnosis not present

## 2015-10-11 LAB — FOLATE RBC
Folate, Hemolysate: 309.6 ng/mL
Folate, RBC: 913 ng/mL (ref >498)
Hematocrit: 33.9 % — ABNORMAL LOW (ref 34.0–46.6)

## 2015-10-13 DIAGNOSIS — R2689 Other abnormalities of gait and mobility: Secondary | ICD-10-CM | POA: Diagnosis not present

## 2015-10-13 DIAGNOSIS — M79602 Pain in left arm: Secondary | ICD-10-CM | POA: Diagnosis not present

## 2015-10-14 DIAGNOSIS — M25512 Pain in left shoulder: Secondary | ICD-10-CM | POA: Diagnosis not present

## 2015-10-14 DIAGNOSIS — R2689 Other abnormalities of gait and mobility: Secondary | ICD-10-CM | POA: Diagnosis not present

## 2015-10-21 DIAGNOSIS — S42272D Torus fracture of upper end of left humerus, subsequent encounter for fracture with routine healing: Secondary | ICD-10-CM | POA: Diagnosis not present

## 2015-10-21 DIAGNOSIS — Z4789 Encounter for other orthopedic aftercare: Secondary | ICD-10-CM | POA: Diagnosis not present

## 2015-11-03 DIAGNOSIS — M25512 Pain in left shoulder: Secondary | ICD-10-CM | POA: Diagnosis not present

## 2015-11-03 DIAGNOSIS — R2689 Other abnormalities of gait and mobility: Secondary | ICD-10-CM | POA: Diagnosis not present

## 2015-11-08 DIAGNOSIS — E785 Hyperlipidemia, unspecified: Secondary | ICD-10-CM | POA: Diagnosis not present

## 2015-11-08 DIAGNOSIS — Z9181 History of falling: Secondary | ICD-10-CM | POA: Diagnosis not present

## 2015-11-08 DIAGNOSIS — H409 Unspecified glaucoma: Secondary | ICD-10-CM | POA: Diagnosis not present

## 2015-11-08 DIAGNOSIS — I10 Essential (primary) hypertension: Secondary | ICD-10-CM | POA: Diagnosis not present

## 2015-11-08 DIAGNOSIS — K5909 Other constipation: Secondary | ICD-10-CM | POA: Diagnosis not present

## 2015-11-08 DIAGNOSIS — Z96612 Presence of left artificial shoulder joint: Secondary | ICD-10-CM | POA: Diagnosis not present

## 2015-11-08 DIAGNOSIS — Z471 Aftercare following joint replacement surgery: Secondary | ICD-10-CM | POA: Diagnosis not present

## 2015-11-08 DIAGNOSIS — Z87440 Personal history of urinary (tract) infections: Secondary | ICD-10-CM | POA: Diagnosis not present

## 2015-11-08 DIAGNOSIS — F418 Other specified anxiety disorders: Secondary | ICD-10-CM | POA: Diagnosis not present

## 2015-11-08 DIAGNOSIS — R42 Dizziness and giddiness: Secondary | ICD-10-CM | POA: Diagnosis not present

## 2015-11-09 DIAGNOSIS — R42 Dizziness and giddiness: Secondary | ICD-10-CM | POA: Diagnosis not present

## 2015-11-09 DIAGNOSIS — H409 Unspecified glaucoma: Secondary | ICD-10-CM | POA: Diagnosis not present

## 2015-11-09 DIAGNOSIS — Z471 Aftercare following joint replacement surgery: Secondary | ICD-10-CM | POA: Diagnosis not present

## 2015-11-09 DIAGNOSIS — F418 Other specified anxiety disorders: Secondary | ICD-10-CM | POA: Diagnosis not present

## 2015-11-09 DIAGNOSIS — K5909 Other constipation: Secondary | ICD-10-CM | POA: Diagnosis not present

## 2015-11-09 DIAGNOSIS — I10 Essential (primary) hypertension: Secondary | ICD-10-CM | POA: Diagnosis not present

## 2015-11-10 DIAGNOSIS — F4323 Adjustment disorder with mixed anxiety and depressed mood: Secondary | ICD-10-CM | POA: Diagnosis not present

## 2015-11-10 DIAGNOSIS — S4292XA Fracture of left shoulder girdle, part unspecified, initial encounter for closed fracture: Secondary | ICD-10-CM | POA: Diagnosis not present

## 2015-11-10 DIAGNOSIS — E43 Unspecified severe protein-calorie malnutrition: Secondary | ICD-10-CM | POA: Diagnosis not present

## 2015-11-10 DIAGNOSIS — E876 Hypokalemia: Secondary | ICD-10-CM | POA: Diagnosis not present

## 2015-11-10 DIAGNOSIS — N390 Urinary tract infection, site not specified: Secondary | ICD-10-CM | POA: Diagnosis not present

## 2015-11-10 DIAGNOSIS — Z96619 Presence of unspecified artificial shoulder joint: Secondary | ICD-10-CM | POA: Diagnosis not present

## 2015-11-10 DIAGNOSIS — I10 Essential (primary) hypertension: Secondary | ICD-10-CM | POA: Diagnosis not present

## 2015-11-10 DIAGNOSIS — I447 Left bundle-branch block, unspecified: Secondary | ICD-10-CM | POA: Diagnosis not present

## 2015-11-10 DIAGNOSIS — Z87440 Personal history of urinary (tract) infections: Secondary | ICD-10-CM | POA: Diagnosis not present

## 2015-11-11 DIAGNOSIS — H409 Unspecified glaucoma: Secondary | ICD-10-CM | POA: Diagnosis not present

## 2015-11-11 DIAGNOSIS — K5909 Other constipation: Secondary | ICD-10-CM | POA: Diagnosis not present

## 2015-11-11 DIAGNOSIS — F418 Other specified anxiety disorders: Secondary | ICD-10-CM | POA: Diagnosis not present

## 2015-11-11 DIAGNOSIS — I10 Essential (primary) hypertension: Secondary | ICD-10-CM | POA: Diagnosis not present

## 2015-11-11 DIAGNOSIS — Z471 Aftercare following joint replacement surgery: Secondary | ICD-10-CM | POA: Diagnosis not present

## 2015-11-11 DIAGNOSIS — R42 Dizziness and giddiness: Secondary | ICD-10-CM | POA: Diagnosis not present

## 2015-11-12 DIAGNOSIS — I10 Essential (primary) hypertension: Secondary | ICD-10-CM | POA: Diagnosis not present

## 2015-11-12 DIAGNOSIS — I499 Cardiac arrhythmia, unspecified: Secondary | ICD-10-CM | POA: Diagnosis not present

## 2015-11-12 DIAGNOSIS — R42 Dizziness and giddiness: Secondary | ICD-10-CM | POA: Diagnosis not present

## 2015-11-12 DIAGNOSIS — F418 Other specified anxiety disorders: Secondary | ICD-10-CM | POA: Diagnosis not present

## 2015-11-12 DIAGNOSIS — Z471 Aftercare following joint replacement surgery: Secondary | ICD-10-CM | POA: Diagnosis not present

## 2015-11-12 DIAGNOSIS — H409 Unspecified glaucoma: Secondary | ICD-10-CM | POA: Diagnosis not present

## 2015-11-12 DIAGNOSIS — K5909 Other constipation: Secondary | ICD-10-CM | POA: Diagnosis not present

## 2015-11-15 DIAGNOSIS — H409 Unspecified glaucoma: Secondary | ICD-10-CM | POA: Diagnosis not present

## 2015-11-15 DIAGNOSIS — K5909 Other constipation: Secondary | ICD-10-CM | POA: Diagnosis not present

## 2015-11-15 DIAGNOSIS — I10 Essential (primary) hypertension: Secondary | ICD-10-CM | POA: Diagnosis not present

## 2015-11-15 DIAGNOSIS — Z471 Aftercare following joint replacement surgery: Secondary | ICD-10-CM | POA: Diagnosis not present

## 2015-11-15 DIAGNOSIS — F418 Other specified anxiety disorders: Secondary | ICD-10-CM | POA: Diagnosis not present

## 2015-11-15 DIAGNOSIS — R42 Dizziness and giddiness: Secondary | ICD-10-CM | POA: Diagnosis not present

## 2015-11-16 DIAGNOSIS — I10 Essential (primary) hypertension: Secondary | ICD-10-CM | POA: Diagnosis not present

## 2015-11-16 DIAGNOSIS — H409 Unspecified glaucoma: Secondary | ICD-10-CM | POA: Diagnosis not present

## 2015-11-16 DIAGNOSIS — K5909 Other constipation: Secondary | ICD-10-CM | POA: Diagnosis not present

## 2015-11-16 DIAGNOSIS — F418 Other specified anxiety disorders: Secondary | ICD-10-CM | POA: Diagnosis not present

## 2015-11-16 DIAGNOSIS — R42 Dizziness and giddiness: Secondary | ICD-10-CM | POA: Diagnosis not present

## 2015-11-16 DIAGNOSIS — Z471 Aftercare following joint replacement surgery: Secondary | ICD-10-CM | POA: Diagnosis not present

## 2015-11-18 DIAGNOSIS — H409 Unspecified glaucoma: Secondary | ICD-10-CM | POA: Diagnosis not present

## 2015-11-18 DIAGNOSIS — K5909 Other constipation: Secondary | ICD-10-CM | POA: Diagnosis not present

## 2015-11-18 DIAGNOSIS — Z96612 Presence of left artificial shoulder joint: Secondary | ICD-10-CM | POA: Diagnosis not present

## 2015-11-18 DIAGNOSIS — S42272D Torus fracture of upper end of left humerus, subsequent encounter for fracture with routine healing: Secondary | ICD-10-CM | POA: Diagnosis not present

## 2015-11-18 DIAGNOSIS — Z471 Aftercare following joint replacement surgery: Secondary | ICD-10-CM | POA: Diagnosis not present

## 2015-11-18 DIAGNOSIS — I10 Essential (primary) hypertension: Secondary | ICD-10-CM | POA: Diagnosis not present

## 2015-11-18 DIAGNOSIS — F418 Other specified anxiety disorders: Secondary | ICD-10-CM | POA: Diagnosis not present

## 2015-11-18 DIAGNOSIS — Z4789 Encounter for other orthopedic aftercare: Secondary | ICD-10-CM | POA: Diagnosis not present

## 2015-11-18 DIAGNOSIS — R42 Dizziness and giddiness: Secondary | ICD-10-CM | POA: Diagnosis not present

## 2015-11-19 DIAGNOSIS — H409 Unspecified glaucoma: Secondary | ICD-10-CM | POA: Diagnosis not present

## 2015-11-19 DIAGNOSIS — F418 Other specified anxiety disorders: Secondary | ICD-10-CM | POA: Diagnosis not present

## 2015-11-19 DIAGNOSIS — R42 Dizziness and giddiness: Secondary | ICD-10-CM | POA: Diagnosis not present

## 2015-11-19 DIAGNOSIS — I10 Essential (primary) hypertension: Secondary | ICD-10-CM | POA: Diagnosis not present

## 2015-11-19 DIAGNOSIS — K5909 Other constipation: Secondary | ICD-10-CM | POA: Diagnosis not present

## 2015-11-19 DIAGNOSIS — Z471 Aftercare following joint replacement surgery: Secondary | ICD-10-CM | POA: Diagnosis not present

## 2015-11-22 DIAGNOSIS — R42 Dizziness and giddiness: Secondary | ICD-10-CM | POA: Diagnosis not present

## 2015-11-22 DIAGNOSIS — F418 Other specified anxiety disorders: Secondary | ICD-10-CM | POA: Diagnosis not present

## 2015-11-22 DIAGNOSIS — K5909 Other constipation: Secondary | ICD-10-CM | POA: Diagnosis not present

## 2015-11-22 DIAGNOSIS — Z471 Aftercare following joint replacement surgery: Secondary | ICD-10-CM | POA: Diagnosis not present

## 2015-11-22 DIAGNOSIS — E876 Hypokalemia: Secondary | ICD-10-CM | POA: Diagnosis not present

## 2015-11-22 DIAGNOSIS — H409 Unspecified glaucoma: Secondary | ICD-10-CM | POA: Diagnosis not present

## 2015-11-22 DIAGNOSIS — I10 Essential (primary) hypertension: Secondary | ICD-10-CM | POA: Diagnosis not present

## 2015-11-23 DIAGNOSIS — Z471 Aftercare following joint replacement surgery: Secondary | ICD-10-CM | POA: Diagnosis not present

## 2015-11-23 DIAGNOSIS — K5909 Other constipation: Secondary | ICD-10-CM | POA: Diagnosis not present

## 2015-11-23 DIAGNOSIS — R42 Dizziness and giddiness: Secondary | ICD-10-CM | POA: Diagnosis not present

## 2015-11-23 DIAGNOSIS — I10 Essential (primary) hypertension: Secondary | ICD-10-CM | POA: Diagnosis not present

## 2015-11-23 DIAGNOSIS — F418 Other specified anxiety disorders: Secondary | ICD-10-CM | POA: Diagnosis not present

## 2015-11-23 DIAGNOSIS — H409 Unspecified glaucoma: Secondary | ICD-10-CM | POA: Diagnosis not present

## 2015-11-24 DIAGNOSIS — K5909 Other constipation: Secondary | ICD-10-CM | POA: Diagnosis not present

## 2015-11-24 DIAGNOSIS — Z471 Aftercare following joint replacement surgery: Secondary | ICD-10-CM | POA: Diagnosis not present

## 2015-11-24 DIAGNOSIS — I10 Essential (primary) hypertension: Secondary | ICD-10-CM | POA: Diagnosis not present

## 2015-11-24 DIAGNOSIS — F418 Other specified anxiety disorders: Secondary | ICD-10-CM | POA: Diagnosis not present

## 2015-11-24 DIAGNOSIS — R42 Dizziness and giddiness: Secondary | ICD-10-CM | POA: Diagnosis not present

## 2015-11-24 DIAGNOSIS — H409 Unspecified glaucoma: Secondary | ICD-10-CM | POA: Diagnosis not present

## 2015-11-25 DIAGNOSIS — H409 Unspecified glaucoma: Secondary | ICD-10-CM | POA: Diagnosis not present

## 2015-11-25 DIAGNOSIS — F418 Other specified anxiety disorders: Secondary | ICD-10-CM | POA: Diagnosis not present

## 2015-11-25 DIAGNOSIS — Z471 Aftercare following joint replacement surgery: Secondary | ICD-10-CM | POA: Diagnosis not present

## 2015-11-25 DIAGNOSIS — K5909 Other constipation: Secondary | ICD-10-CM | POA: Diagnosis not present

## 2015-11-25 DIAGNOSIS — R42 Dizziness and giddiness: Secondary | ICD-10-CM | POA: Diagnosis not present

## 2015-11-25 DIAGNOSIS — I10 Essential (primary) hypertension: Secondary | ICD-10-CM | POA: Diagnosis not present

## 2015-11-29 DIAGNOSIS — K5909 Other constipation: Secondary | ICD-10-CM | POA: Diagnosis not present

## 2015-11-29 DIAGNOSIS — R42 Dizziness and giddiness: Secondary | ICD-10-CM | POA: Diagnosis not present

## 2015-11-29 DIAGNOSIS — H409 Unspecified glaucoma: Secondary | ICD-10-CM | POA: Diagnosis not present

## 2015-11-29 DIAGNOSIS — F418 Other specified anxiety disorders: Secondary | ICD-10-CM | POA: Diagnosis not present

## 2015-11-29 DIAGNOSIS — I10 Essential (primary) hypertension: Secondary | ICD-10-CM | POA: Diagnosis not present

## 2015-11-29 DIAGNOSIS — Z471 Aftercare following joint replacement surgery: Secondary | ICD-10-CM | POA: Diagnosis not present

## 2015-11-30 DIAGNOSIS — I10 Essential (primary) hypertension: Secondary | ICD-10-CM | POA: Diagnosis not present

## 2015-11-30 DIAGNOSIS — H409 Unspecified glaucoma: Secondary | ICD-10-CM | POA: Diagnosis not present

## 2015-11-30 DIAGNOSIS — R42 Dizziness and giddiness: Secondary | ICD-10-CM | POA: Diagnosis not present

## 2015-11-30 DIAGNOSIS — K5909 Other constipation: Secondary | ICD-10-CM | POA: Diagnosis not present

## 2015-11-30 DIAGNOSIS — F418 Other specified anxiety disorders: Secondary | ICD-10-CM | POA: Diagnosis not present

## 2015-11-30 DIAGNOSIS — Z471 Aftercare following joint replacement surgery: Secondary | ICD-10-CM | POA: Diagnosis not present

## 2015-12-01 DIAGNOSIS — E876 Hypokalemia: Secondary | ICD-10-CM | POA: Diagnosis not present

## 2015-12-01 DIAGNOSIS — E7800 Pure hypercholesterolemia, unspecified: Secondary | ICD-10-CM | POA: Diagnosis not present

## 2015-12-01 DIAGNOSIS — E43 Unspecified severe protein-calorie malnutrition: Secondary | ICD-10-CM | POA: Diagnosis not present

## 2015-12-01 DIAGNOSIS — K5900 Constipation, unspecified: Secondary | ICD-10-CM | POA: Diagnosis not present

## 2015-12-01 DIAGNOSIS — E559 Vitamin D deficiency, unspecified: Secondary | ICD-10-CM | POA: Diagnosis not present

## 2015-12-01 DIAGNOSIS — Z96619 Presence of unspecified artificial shoulder joint: Secondary | ICD-10-CM | POA: Diagnosis not present

## 2015-12-01 DIAGNOSIS — I10 Essential (primary) hypertension: Secondary | ICD-10-CM | POA: Diagnosis not present

## 2015-12-01 DIAGNOSIS — F419 Anxiety disorder, unspecified: Secondary | ICD-10-CM | POA: Diagnosis not present

## 2015-12-03 DIAGNOSIS — Z471 Aftercare following joint replacement surgery: Secondary | ICD-10-CM | POA: Diagnosis not present

## 2015-12-03 DIAGNOSIS — F418 Other specified anxiety disorders: Secondary | ICD-10-CM | POA: Diagnosis not present

## 2015-12-03 DIAGNOSIS — R42 Dizziness and giddiness: Secondary | ICD-10-CM | POA: Diagnosis not present

## 2015-12-03 DIAGNOSIS — I10 Essential (primary) hypertension: Secondary | ICD-10-CM | POA: Diagnosis not present

## 2015-12-03 DIAGNOSIS — H409 Unspecified glaucoma: Secondary | ICD-10-CM | POA: Diagnosis not present

## 2015-12-03 DIAGNOSIS — K5909 Other constipation: Secondary | ICD-10-CM | POA: Diagnosis not present

## 2015-12-06 DIAGNOSIS — R42 Dizziness and giddiness: Secondary | ICD-10-CM | POA: Diagnosis not present

## 2015-12-06 DIAGNOSIS — H409 Unspecified glaucoma: Secondary | ICD-10-CM | POA: Diagnosis not present

## 2015-12-06 DIAGNOSIS — F418 Other specified anxiety disorders: Secondary | ICD-10-CM | POA: Diagnosis not present

## 2015-12-06 DIAGNOSIS — I10 Essential (primary) hypertension: Secondary | ICD-10-CM | POA: Diagnosis not present

## 2015-12-06 DIAGNOSIS — K5909 Other constipation: Secondary | ICD-10-CM | POA: Diagnosis not present

## 2015-12-06 DIAGNOSIS — Z471 Aftercare following joint replacement surgery: Secondary | ICD-10-CM | POA: Diagnosis not present

## 2015-12-07 DIAGNOSIS — I10 Essential (primary) hypertension: Secondary | ICD-10-CM | POA: Diagnosis not present

## 2015-12-07 DIAGNOSIS — Z471 Aftercare following joint replacement surgery: Secondary | ICD-10-CM | POA: Diagnosis not present

## 2015-12-07 DIAGNOSIS — K5909 Other constipation: Secondary | ICD-10-CM | POA: Diagnosis not present

## 2015-12-07 DIAGNOSIS — H409 Unspecified glaucoma: Secondary | ICD-10-CM | POA: Diagnosis not present

## 2015-12-07 DIAGNOSIS — F418 Other specified anxiety disorders: Secondary | ICD-10-CM | POA: Diagnosis not present

## 2015-12-07 DIAGNOSIS — R42 Dizziness and giddiness: Secondary | ICD-10-CM | POA: Diagnosis not present

## 2015-12-08 DIAGNOSIS — K5909 Other constipation: Secondary | ICD-10-CM | POA: Diagnosis not present

## 2015-12-08 DIAGNOSIS — Z471 Aftercare following joint replacement surgery: Secondary | ICD-10-CM | POA: Diagnosis not present

## 2015-12-08 DIAGNOSIS — I10 Essential (primary) hypertension: Secondary | ICD-10-CM | POA: Diagnosis not present

## 2015-12-08 DIAGNOSIS — H409 Unspecified glaucoma: Secondary | ICD-10-CM | POA: Diagnosis not present

## 2015-12-08 DIAGNOSIS — R42 Dizziness and giddiness: Secondary | ICD-10-CM | POA: Diagnosis not present

## 2015-12-08 DIAGNOSIS — F418 Other specified anxiety disorders: Secondary | ICD-10-CM | POA: Diagnosis not present

## 2015-12-10 DIAGNOSIS — H409 Unspecified glaucoma: Secondary | ICD-10-CM | POA: Diagnosis not present

## 2015-12-10 DIAGNOSIS — R42 Dizziness and giddiness: Secondary | ICD-10-CM | POA: Diagnosis not present

## 2015-12-10 DIAGNOSIS — K5909 Other constipation: Secondary | ICD-10-CM | POA: Diagnosis not present

## 2015-12-10 DIAGNOSIS — I10 Essential (primary) hypertension: Secondary | ICD-10-CM | POA: Diagnosis not present

## 2015-12-10 DIAGNOSIS — F418 Other specified anxiety disorders: Secondary | ICD-10-CM | POA: Diagnosis not present

## 2015-12-10 DIAGNOSIS — Z471 Aftercare following joint replacement surgery: Secondary | ICD-10-CM | POA: Diagnosis not present

## 2015-12-13 DIAGNOSIS — R42 Dizziness and giddiness: Secondary | ICD-10-CM | POA: Diagnosis not present

## 2015-12-13 DIAGNOSIS — F418 Other specified anxiety disorders: Secondary | ICD-10-CM | POA: Diagnosis not present

## 2015-12-13 DIAGNOSIS — I10 Essential (primary) hypertension: Secondary | ICD-10-CM | POA: Diagnosis not present

## 2015-12-13 DIAGNOSIS — Z471 Aftercare following joint replacement surgery: Secondary | ICD-10-CM | POA: Diagnosis not present

## 2015-12-13 DIAGNOSIS — H409 Unspecified glaucoma: Secondary | ICD-10-CM | POA: Diagnosis not present

## 2015-12-13 DIAGNOSIS — K5909 Other constipation: Secondary | ICD-10-CM | POA: Diagnosis not present

## 2015-12-15 DIAGNOSIS — H409 Unspecified glaucoma: Secondary | ICD-10-CM | POA: Diagnosis not present

## 2015-12-15 DIAGNOSIS — Z471 Aftercare following joint replacement surgery: Secondary | ICD-10-CM | POA: Diagnosis not present

## 2015-12-15 DIAGNOSIS — K5909 Other constipation: Secondary | ICD-10-CM | POA: Diagnosis not present

## 2015-12-15 DIAGNOSIS — F418 Other specified anxiety disorders: Secondary | ICD-10-CM | POA: Diagnosis not present

## 2015-12-15 DIAGNOSIS — R42 Dizziness and giddiness: Secondary | ICD-10-CM | POA: Diagnosis not present

## 2015-12-15 DIAGNOSIS — I10 Essential (primary) hypertension: Secondary | ICD-10-CM | POA: Diagnosis not present

## 2015-12-16 DIAGNOSIS — F4321 Adjustment disorder with depressed mood: Secondary | ICD-10-CM | POA: Diagnosis not present

## 2015-12-20 DIAGNOSIS — R42 Dizziness and giddiness: Secondary | ICD-10-CM | POA: Diagnosis not present

## 2015-12-20 DIAGNOSIS — K5909 Other constipation: Secondary | ICD-10-CM | POA: Diagnosis not present

## 2015-12-20 DIAGNOSIS — F418 Other specified anxiety disorders: Secondary | ICD-10-CM | POA: Diagnosis not present

## 2015-12-20 DIAGNOSIS — I10 Essential (primary) hypertension: Secondary | ICD-10-CM | POA: Diagnosis not present

## 2015-12-20 DIAGNOSIS — H409 Unspecified glaucoma: Secondary | ICD-10-CM | POA: Diagnosis not present

## 2015-12-20 DIAGNOSIS — Z471 Aftercare following joint replacement surgery: Secondary | ICD-10-CM | POA: Diagnosis not present

## 2015-12-21 DIAGNOSIS — Z96612 Presence of left artificial shoulder joint: Secondary | ICD-10-CM | POA: Diagnosis not present

## 2015-12-21 DIAGNOSIS — S42272D Torus fracture of upper end of left humerus, subsequent encounter for fracture with routine healing: Secondary | ICD-10-CM | POA: Diagnosis not present

## 2015-12-23 DIAGNOSIS — H409 Unspecified glaucoma: Secondary | ICD-10-CM | POA: Diagnosis not present

## 2015-12-23 DIAGNOSIS — F418 Other specified anxiety disorders: Secondary | ICD-10-CM | POA: Diagnosis not present

## 2015-12-23 DIAGNOSIS — K5909 Other constipation: Secondary | ICD-10-CM | POA: Diagnosis not present

## 2015-12-23 DIAGNOSIS — R42 Dizziness and giddiness: Secondary | ICD-10-CM | POA: Diagnosis not present

## 2015-12-23 DIAGNOSIS — Z471 Aftercare following joint replacement surgery: Secondary | ICD-10-CM | POA: Diagnosis not present

## 2015-12-23 DIAGNOSIS — I10 Essential (primary) hypertension: Secondary | ICD-10-CM | POA: Diagnosis not present

## 2015-12-28 DIAGNOSIS — F4321 Adjustment disorder with depressed mood: Secondary | ICD-10-CM | POA: Diagnosis not present

## 2015-12-29 DIAGNOSIS — K5909 Other constipation: Secondary | ICD-10-CM | POA: Diagnosis not present

## 2015-12-29 DIAGNOSIS — I10 Essential (primary) hypertension: Secondary | ICD-10-CM | POA: Diagnosis not present

## 2015-12-29 DIAGNOSIS — F418 Other specified anxiety disorders: Secondary | ICD-10-CM | POA: Diagnosis not present

## 2015-12-29 DIAGNOSIS — H409 Unspecified glaucoma: Secondary | ICD-10-CM | POA: Diagnosis not present

## 2015-12-29 DIAGNOSIS — Z471 Aftercare following joint replacement surgery: Secondary | ICD-10-CM | POA: Diagnosis not present

## 2015-12-29 DIAGNOSIS — R42 Dizziness and giddiness: Secondary | ICD-10-CM | POA: Diagnosis not present

## 2015-12-31 DIAGNOSIS — K5909 Other constipation: Secondary | ICD-10-CM | POA: Diagnosis not present

## 2015-12-31 DIAGNOSIS — R42 Dizziness and giddiness: Secondary | ICD-10-CM | POA: Diagnosis not present

## 2015-12-31 DIAGNOSIS — F418 Other specified anxiety disorders: Secondary | ICD-10-CM | POA: Diagnosis not present

## 2015-12-31 DIAGNOSIS — Z471 Aftercare following joint replacement surgery: Secondary | ICD-10-CM | POA: Diagnosis not present

## 2015-12-31 DIAGNOSIS — H409 Unspecified glaucoma: Secondary | ICD-10-CM | POA: Diagnosis not present

## 2015-12-31 DIAGNOSIS — I10 Essential (primary) hypertension: Secondary | ICD-10-CM | POA: Diagnosis not present

## 2016-01-03 DIAGNOSIS — Z471 Aftercare following joint replacement surgery: Secondary | ICD-10-CM | POA: Diagnosis not present

## 2016-01-03 DIAGNOSIS — F418 Other specified anxiety disorders: Secondary | ICD-10-CM | POA: Diagnosis not present

## 2016-01-03 DIAGNOSIS — K5909 Other constipation: Secondary | ICD-10-CM | POA: Diagnosis not present

## 2016-01-03 DIAGNOSIS — I10 Essential (primary) hypertension: Secondary | ICD-10-CM | POA: Diagnosis not present

## 2016-01-03 DIAGNOSIS — H409 Unspecified glaucoma: Secondary | ICD-10-CM | POA: Diagnosis not present

## 2016-01-03 DIAGNOSIS — R42 Dizziness and giddiness: Secondary | ICD-10-CM | POA: Diagnosis not present

## 2016-01-05 DIAGNOSIS — F418 Other specified anxiety disorders: Secondary | ICD-10-CM | POA: Diagnosis not present

## 2016-01-05 DIAGNOSIS — H409 Unspecified glaucoma: Secondary | ICD-10-CM | POA: Diagnosis not present

## 2016-01-05 DIAGNOSIS — R42 Dizziness and giddiness: Secondary | ICD-10-CM | POA: Diagnosis not present

## 2016-01-05 DIAGNOSIS — Z471 Aftercare following joint replacement surgery: Secondary | ICD-10-CM | POA: Diagnosis not present

## 2016-01-05 DIAGNOSIS — K5909 Other constipation: Secondary | ICD-10-CM | POA: Diagnosis not present

## 2016-01-05 DIAGNOSIS — I10 Essential (primary) hypertension: Secondary | ICD-10-CM | POA: Diagnosis not present

## 2016-01-11 DIAGNOSIS — F4321 Adjustment disorder with depressed mood: Secondary | ICD-10-CM | POA: Diagnosis not present

## 2016-02-15 DIAGNOSIS — H409 Unspecified glaucoma: Secondary | ICD-10-CM | POA: Diagnosis not present

## 2016-02-15 DIAGNOSIS — E7800 Pure hypercholesterolemia, unspecified: Secondary | ICD-10-CM | POA: Diagnosis not present

## 2016-02-15 DIAGNOSIS — I10 Essential (primary) hypertension: Secondary | ICD-10-CM | POA: Diagnosis not present

## 2016-02-15 DIAGNOSIS — E876 Hypokalemia: Secondary | ICD-10-CM | POA: Diagnosis not present

## 2016-02-15 DIAGNOSIS — Z23 Encounter for immunization: Secondary | ICD-10-CM | POA: Diagnosis not present

## 2016-02-15 DIAGNOSIS — Z7189 Other specified counseling: Secondary | ICD-10-CM | POA: Diagnosis not present

## 2016-02-15 DIAGNOSIS — E559 Vitamin D deficiency, unspecified: Secondary | ICD-10-CM | POA: Diagnosis not present

## 2016-02-15 DIAGNOSIS — E46 Unspecified protein-calorie malnutrition: Secondary | ICD-10-CM | POA: Diagnosis not present

## 2016-02-15 DIAGNOSIS — G479 Sleep disorder, unspecified: Secondary | ICD-10-CM | POA: Diagnosis not present

## 2016-02-15 DIAGNOSIS — F419 Anxiety disorder, unspecified: Secondary | ICD-10-CM | POA: Diagnosis not present

## 2016-04-06 DIAGNOSIS — H524 Presbyopia: Secondary | ICD-10-CM | POA: Diagnosis not present

## 2016-04-06 DIAGNOSIS — H401131 Primary open-angle glaucoma, bilateral, mild stage: Secondary | ICD-10-CM | POA: Diagnosis not present

## 2016-04-06 DIAGNOSIS — H53032 Strabismic amblyopia, left eye: Secondary | ICD-10-CM | POA: Diagnosis not present

## 2016-04-14 DIAGNOSIS — F4321 Adjustment disorder with depressed mood: Secondary | ICD-10-CM | POA: Diagnosis not present

## 2016-06-07 ENCOUNTER — Emergency Department (HOSPITAL_COMMUNITY): Payer: Medicare Other

## 2016-06-07 ENCOUNTER — Emergency Department (HOSPITAL_COMMUNITY)
Admission: EM | Admit: 2016-06-07 | Discharge: 2016-06-07 | Disposition: A | Discharge status: Home / Self Care | Payer: Medicare Other | Attending: Emergency Medicine | Admitting: Emergency Medicine

## 2016-06-07 ENCOUNTER — Encounter (HOSPITAL_COMMUNITY): Payer: Self-pay | Admitting: Emergency Medicine

## 2016-06-07 DIAGNOSIS — Z79899 Other long term (current) drug therapy: Secondary | ICD-10-CM | POA: Insufficient documentation

## 2016-06-07 DIAGNOSIS — Y999 Unspecified external cause status: Secondary | ICD-10-CM | POA: Insufficient documentation

## 2016-06-07 DIAGNOSIS — Z7982 Long term (current) use of aspirin: Secondary | ICD-10-CM | POA: Insufficient documentation

## 2016-06-07 DIAGNOSIS — Y939 Activity, unspecified: Secondary | ICD-10-CM | POA: Diagnosis not present

## 2016-06-07 DIAGNOSIS — S0990XA Unspecified injury of head, initial encounter: Secondary | ICD-10-CM | POA: Diagnosis not present

## 2016-06-07 DIAGNOSIS — W19XXXA Unspecified fall, initial encounter: Secondary | ICD-10-CM

## 2016-06-07 DIAGNOSIS — I10 Essential (primary) hypertension: Secondary | ICD-10-CM | POA: Diagnosis not present

## 2016-06-07 DIAGNOSIS — S0101XA Laceration without foreign body of scalp, initial encounter: Secondary | ICD-10-CM | POA: Insufficient documentation

## 2016-06-07 DIAGNOSIS — Y929 Unspecified place or not applicable: Secondary | ICD-10-CM | POA: Diagnosis not present

## 2016-06-07 DIAGNOSIS — W1839XA Other fall on same level, initial encounter: Secondary | ICD-10-CM | POA: Insufficient documentation

## 2016-06-07 DIAGNOSIS — S0003XA Contusion of scalp, initial encounter: Secondary | ICD-10-CM | POA: Diagnosis not present

## 2016-06-07 DIAGNOSIS — S0180XA Unspecified open wound of other part of head, initial encounter: Secondary | ICD-10-CM | POA: Diagnosis not present

## 2016-06-07 DIAGNOSIS — S199XXA Unspecified injury of neck, initial encounter: Secondary | ICD-10-CM | POA: Diagnosis not present

## 2016-06-07 MED ORDER — LIDOCAINE-EPINEPHRINE-TETRACAINE (LET) SOLUTION
3.0000 mL | Freq: Once | NASAL | Status: AC
Start: 2016-06-07 — End: 2016-06-07
  Administered 2016-06-07: 3 mL via TOPICAL
  Filled 2016-06-07: qty 3

## 2016-06-07 NOTE — ED Notes (Signed)
Bed: [id number] Expected date:  Expected time:  Means of arrival:  Comments: 87 Fall

## 2016-06-07 NOTE — ED Provider Notes (Signed)
[facility] DEPT Provider Note   CSN: [id number] Arrival date & time: 06/07/16  [id number]  First Provider Contact:  First MD Initiated Contact with Patient 06/07/16 [phone]        History   Chief Complaint Chief Complaint  Patient presents with  . Fall    HPI Eileen Reeves is a 82 y.o. female.  The history is provided by the patient and the EMS personnel.  Fall  This is a new problem. The current episode started less than 1 hour ago. The problem occurs constantly. The problem has not changed since onset.Pertinent negatives include no chest pain and no abdominal pain. Nothing aggravates the symptoms. Nothing relieves the symptoms. She has tried nothing for the symptoms. The treatment provided no relief.    Past Medical History:  Diagnosis Date  . Dizziness     Patient Active Problem List   Diagnosis Date Noted  . S/P shoulder replacement 10/07/2015  . Adjustment disorder with mixed anxiety and depressed mood 10/05/2015  . Paresthesia of both hands rule out neuropathy 10/05/2015  . Chronic constipation 10/05/2015  . Protein-calorie malnutrition, severe 10/05/2015  . Shoulder fracture, left 10/05/2015  . Dizziness 10/04/2015  . UTI (lower urinary tract infection) 10/04/2015  . Benign essential HTN 10/04/2015  . Hypokalemia 10/04/2015  . Dyslipidemia 10/04/2015    Past Surgical History:  Procedure Laterality Date  . REVERSE SHOULDER ARTHROPLASTY Left 10/07/2015   Procedure: LEFT REVERSE SHOULDER ARTHROPLASTY;  Surgeon: Netta Cedars, MD;  Location: [facility];  Service: Orthopedics;  Laterality: Left;    OB History    No data available       Home Medications    Prior to Admission medications   Medication Sig Start Date End Date Taking? Authorizing Provider  acetaminophen (TYLENOL) 500 MG tablet Take 500 mg by mouth every 6 (six) hours as needed (For pain.).    Historical Provider, MD  ALPRAZolam Duanne Moron) 0.5 MG tablet Take 1 tablet (0.5 mg total) by mouth 3 (three)  times daily as needed for anxiety. 10/09/15   Theodis Blaze, MD  amLODipine-benazepril (LOTREL) 5-10 MG capsule Take 1 capsule by mouth daily. 09/30/15   Historical Provider, MD  aspirin 81 MG tablet Take 81 mg by mouth daily.    Historical Provider, MD  bisacodyl (DULCOLAX) 10 MG suppository Place 1 suppository (10 mg total) rectally daily as needed for moderate constipation. 10/09/15   Theodis Blaze, MD  cephALEXin (KEFLEX) 500 MG capsule Take 1 capsule (500 mg total) by mouth every 12 (twelve) hours. 10/09/15   Theodis Blaze, MD  docusate sodium (COLACE) 100 MG capsule Take 1 capsule (100 mg total) by mouth every 12 (twelve) hours. 09/28/15   Davonna Belling, MD  HYDROcodone-acetaminophen (NORCO) 5-325 MG tablet Take 1 tablet by mouth every 6 (six) hours as needed for moderate pain. 10/09/15   Theodis Blaze, MD  hydrOXYzine (ATARAX/VISTARIL) 10 MG tablet Take 1 tablet (10 mg total) by mouth 3 (three) times daily. 10/09/15   Theodis Blaze, MD  LUMIGAN 0.01 % SOLN Place 1 drop into both eyes daily at 2 PM. 09/30/15   Historical Provider, MD  polyethylene glycol (MIRALAX / GLYCOLAX) packet Take 17 g by mouth daily. 09/28/15   Davonna Belling, MD  Polyvinyl Alcohol-Povidone (REFRESH OP) Place 1 drop into both eyes 3 (three) times daily.    Historical Provider, MD  pravastatin (PRAVACHOL) 20 MG tablet Take 20 mg by mouth daily.    Historical Provider, MD  traMADol Veatrice Bourbon)  50 MG tablet Take 1 tablet (50 mg total) by mouth every 6 (six) hours as needed. 10/09/15   Theodis Blaze, MD  venlafaxine XR (EFFEXOR-XR) 37.5 MG 24 hr capsule Take 1 capsule (37.5 mg total) by mouth daily with breakfast. 10/09/15   Theodis Blaze, MD    Family History No family history on file.  Social History Social History  Substance Use Topics  . Smoking status: Never Smoker  . Smokeless tobacco: Not on file  . Alcohol use No     Allergies   Tetanus toxoids; Penicillins; and Phenobarbital   Review of  Systems Review of Systems  Cardiovascular: Negative for chest pain.  Gastrointestinal: Negative for abdominal pain.  Skin: Positive for wound.  Neurological: Negative for speech difficulty, weakness, light-headedness and numbness.  All other systems reviewed and are negative.    Physical Exam Updated Vital Signs BP 181/90 (BP Location: Right Arm)   Pulse 102   Temp 98.4 F (36.9 C) (Oral)   Resp 18   Ht 5\' 5"  (1.651 m)   Wt 150 lb (68 kg)   SpO2 96%   BMI 24.96 kg/m   Physical Exam  Constitutional: She appears well-developed and well-nourished. No distress.  HENT:  Head: Normocephalic.  Right Ear: External ear normal.  Left Ear: External ear normal.  Mouth/Throat: Oropharynx is clear and moist.  Eyes: EOM are normal. Pupils are equal, round, and reactive to light.  Neck: Normal range of motion. Neck supple.  Cardiovascular: Normal rate and regular rhythm.   Pulmonary/Chest: Effort normal and breath sounds normal.  Abdominal: Soft. Bowel sounds are normal. She exhibits no mass. There is no tenderness. There is no guarding.  Musculoskeletal: Normal range of motion.  Neurological: She is alert.  Skin: Skin is warm and dry. Capillary refill takes less than 2 seconds.     ED Treatments / Results  Labs (all labs ordered are listed, but only abnormal results are displayed) Labs Reviewed - No data to display  EKG  EKG Interpretation  Date/Time:  Wednesday June 07 2016 04:44:20 EDT Ventricular Rate:  109 PR Interval:    QRS Duration: 142 QT Interval:  380 QTC Calculation: 512 R Axis:   -53 Text Interpretation:  Sinus tachycardia  Left bundle branch block has not changed Confirmed by [facility]  MD, Makiyla Linch ([id number]) on 06/07/2016 4:48:27 AM       Radiology Ct Head Wo Contrast  Result Date: 06/07/2016 CLINICAL DATA:  Initial evaluation for acute trauma, hit head on wall. Laceration to top of forehead. EXAM: CT HEAD WITHOUT CONTRAST CT CERVICAL SPINE WITHOUT  CONTRAST TECHNIQUE: Multidetector CT imaging of the head and cervical spine was performed following the standard protocol without intravenous contrast. Multiplanar CT image reconstructions of the cervical spine were also generated. COMPARISON:  Prior MRI from 10/04/2015. FINDINGS: CT HEAD FINDINGS Soft tissue contusion with laceration present at the right forehead. No radiopaque foreign body. Scalp soft tissues otherwise within normal limits. No acute abnormality about the globes and orbits. Visualized paranasal sinuses are clear.  No mastoid effusion. Calvarium intact. Advanced cerebral atrophy with moderate chronic small vessel ischemic disease present. No acute intracranial hemorrhage or evidence for large vessel territory infarct. Approximately 2 cm calcified meningioma present at the right temporal pole. No associated edema or mass effect. No other mass lesion. No midline shift. No hydrocephalus. No extra-axial fluid collection. CT CERVICAL SPINE FINDINGS Straightening of the normal cervical lordosis, which may be related to positioning or muscular spasm. Vertebral  body heights are preserved. Normal C1-2 articulations are intact. No prevertebral soft tissue swelling. No acute fracture or listhesis. Moderate multilevel degenerative spondylolysis as evidenced by intervertebral disc space narrowing and endplate osteophytosis present, greatest at C5-6. Visualized soft tissues of the neck demonstrate no acute abnormality. Scattered vascular calcifications about the carotid bifurcations. Visualized lung apices are clear without evidence of apical pneumothorax. IMPRESSION: CT BRAIN: 1. No acute intracranial process. 2. Right frontal scalp contusion/laceration.  No calvarial fracture. 3. Advanced cerebral atrophy with moderate chronic small vessel ischemic disease. 4. Approximately 2 cm calcified meningioma at the right temporal pole. No associated edema or mass effect. CT CERVICAL SPINE: 1. No acute traumatic injury  within the cervical spine. 2. Straightening of the normal cervical lordosis, which may be related to positioning or muscular spasm. Electronically Signed   By: Jeannine Boga M.D.   On: 06/07/2016 06:06   Ct Cervical Spine Wo Contrast  Result Date: 06/07/2016 CLINICAL DATA:  Initial evaluation for acute trauma, hit head on wall. Laceration to top of forehead. EXAM: CT HEAD WITHOUT CONTRAST CT CERVICAL SPINE WITHOUT CONTRAST TECHNIQUE: Multidetector CT imaging of the head and cervical spine was performed following the standard protocol without intravenous contrast. Multiplanar CT image reconstructions of the cervical spine were also generated. COMPARISON:  Prior MRI from 10/04/2015. FINDINGS: CT HEAD FINDINGS Soft tissue contusion with laceration present at the right forehead. No radiopaque foreign body. Scalp soft tissues otherwise within normal limits. No acute abnormality about the globes and orbits. Visualized paranasal sinuses are clear.  No mastoid effusion. Calvarium intact. Advanced cerebral atrophy with moderate chronic small vessel ischemic disease present. No acute intracranial hemorrhage or evidence for large vessel territory infarct. Approximately 2 cm calcified meningioma present at the right temporal pole. No associated edema or mass effect. No other mass lesion. No midline shift. No hydrocephalus. No extra-axial fluid collection. CT CERVICAL SPINE FINDINGS Straightening of the normal cervical lordosis, which may be related to positioning or muscular spasm. Vertebral body heights are preserved. Normal C1-2 articulations are intact. No prevertebral soft tissue swelling. No acute fracture or listhesis. Moderate multilevel degenerative spondylolysis as evidenced by intervertebral disc space narrowing and endplate osteophytosis present, greatest at C5-6. Visualized soft tissues of the neck demonstrate no acute abnormality. Scattered vascular calcifications about the carotid bifurcations.  Visualized lung apices are clear without evidence of apical pneumothorax. IMPRESSION: CT BRAIN: 1. No acute intracranial process. 2. Right frontal scalp contusion/laceration.  No calvarial fracture. 3. Advanced cerebral atrophy with moderate chronic small vessel ischemic disease. 4. Approximately 2 cm calcified meningioma at the right temporal pole. No associated edema or mass effect. CT CERVICAL SPINE: 1. No acute traumatic injury within the cervical spine. 2. Straightening of the normal cervical lordosis, which may be related to positioning or muscular spasm. Electronically Signed   By: Jeannine Boga M.D.   On: 06/07/2016 06:06    Procedures Procedures (including critical care time)  Medications Ordered in ED Medications  lidocaine-EPINEPHrine-tetracaine (LET) solution (not administered)     Initial Impression / Assessment and Plan / ED Course  I have reviewed the triage vital signs and the nursing notes.  Pertinent labs & imaging results that were available during my care of the patient were reviewed by me and considered in my medical decision making (see chart for details).  Clinical Course    Vitals:   06/07/16 0448 06/07/16 0645  BP: 181/90 (!) 187/110  Pulse: 102 (!) 59  Resp: 18 (!) 28  Temp:  98.4 F (36.9 C)    Results for orders placed or performed during the hospital encounter of 10/04/15  Urine culture  Result Value Ref Range   Specimen Description URINE, RANDOM    Special Requests NONE    Culture      >=100,000 COLONIES/mL ESCHERICHIA COLI Performed at [facility]    Report Status 10/06/2015 FINAL    Organism ID, Bacteria ESCHERICHIA COLI       Susceptibility   Escherichia coli - MIC*    AMPICILLIN <=2 SENSITIVE Sensitive     CEFAZOLIN <=4 SENSITIVE Sensitive     CEFTRIAXONE <=1 SENSITIVE Sensitive     CIPROFLOXACIN <=0.25 SENSITIVE Sensitive     GENTAMICIN <=1 SENSITIVE Sensitive     IMIPENEM <=0.25 SENSITIVE Sensitive     NITROFURANTOIN  <=16 SENSITIVE Sensitive     TRIMETH/SULFA <=20 SENSITIVE Sensitive     AMPICILLIN/SULBACTAM <=2 SENSITIVE Sensitive     PIP/TAZO <=4 SENSITIVE Sensitive     * >=100,000 COLONIES/mL ESCHERICHIA COLI  Urinalysis, Routine w reflex microscopic (not at [facility])  Result Value Ref Range   Color, Urine YELLOW YELLOW   APPearance CLOUDY (A) CLEAR   Specific Gravity, Urine 1.010 1.005 - 1.030   pH 7.0 5.0 - 8.0   Glucose, UA NEGATIVE NEGATIVE mg/dL   Hgb urine dipstick MODERATE (A) NEGATIVE   Bilirubin Urine NEGATIVE NEGATIVE   Ketones, ur NEGATIVE NEGATIVE mg/dL   Protein, ur 30 (A) NEGATIVE mg/dL   Nitrite POSITIVE (A) NEGATIVE   Leukocytes, UA LARGE (A) NEGATIVE  Basic metabolic panel  Result Value Ref Range   Sodium 138 135 - 145 mmol/L   Potassium 3.1 (L) 3.5 - 5.1 mmol/L   Chloride 95 (L) 101 - 111 mmol/L   CO2 32 22 - 32 mmol/L   Glucose, Bld 114 (H) 65 - 99 mg/dL   BUN 8 6 - 20 mg/dL   Creatinine, Ser 0.54 0.44 - 1.00 mg/dL   Calcium 9.1 8.9 - 10.3 mg/dL   GFR calc non Af Amer >60 >60 mL/min   GFR calc Af Amer >60 >60 mL/min   Anion gap 11 5 - 15  CBC with Differential  Result Value Ref Range   WBC 7.7 4.0 - 10.5 K/uL   RBC 4.50 3.87 - 5.11 MIL/uL   Hemoglobin 12.8 12.0 - 15.0 g/dL   HCT 38.2 36.0 - 46.0 %   MCV 84.9 78.0 - 100.0 fL   MCH 28.4 26.0 - 34.0 pg   MCHC 33.5 30.0 - 36.0 g/dL   RDW 14.4 11.5 - 15.5 %   Platelets 293 150 - 400 K/uL   Neutrophils Relative % 80 %   Neutro Abs 6.1 1.7 - 7.7 K/uL   Lymphocytes Relative 10 %   Lymphs Abs 0.8 0.7 - 4.0 K/uL   Monocytes Relative 10 %   Monocytes Absolute 0.7 0.1 - 1.0 K/uL   Eosinophils Relative 0 %   Eosinophils Absolute 0.0 0.0 - 0.7 K/uL   Basophils Relative 0 %   Basophils Absolute 0.0 0.0 - 0.1 K/uL  Urine microscopic-add on  Result Value Ref Range   Squamous Epithelial / LPF 0-5 (A) NONE SEEN   WBC, UA TOO NUMEROUS TO COUNT 0 - 5 WBC/hpf   RBC / HPF 6-30 0 - 5 RBC/hpf   Bacteria, UA MANY (A) NONE SEEN   Basic metabolic panel  Result Value Ref Range   Sodium 137 135 - 145 mmol/L   Potassium 3.2 (L) 3.5 -  5.1 mmol/L   Chloride 96 (L) 101 - 111 mmol/L   CO2 32 22 - 32 mmol/L   Glucose, Bld 127 (H) 65 - 99 mg/dL   BUN 7 6 - 20 mg/dL   Creatinine, Ser 0.65 0.44 - 1.00 mg/dL   Calcium 8.8 (L) 8.9 - 10.3 mg/dL   GFR calc non Af Amer >60 >60 mL/min   GFR calc Af Amer >60 >60 mL/min   Anion gap 9 5 - 15  Magnesium  Result Value Ref Range   Magnesium 1.9 1.7 - 2.4 mg/dL  Phosphorus  Result Value Ref Range   Phosphorus 2.6 2.5 - 4.6 mg/dL  CBC  Result Value Ref Range   WBC 9.0 4.0 - 10.5 K/uL   RBC 4.62 3.87 - 5.11 MIL/uL   Hemoglobin 13.0 12.0 - 15.0 g/dL   HCT 39.4 36.0 - 46.0 %   MCV 85.3 78.0 - 100.0 fL   MCH 28.1 26.0 - 34.0 pg   MCHC 33.0 30.0 - 36.0 g/dL   RDW 14.7 11.5 - 15.5 %   Platelets 325 150 - 400 K/uL  Basic metabolic panel  Result Value Ref Range   Sodium 138 135 - 145 mmol/L   Potassium 3.3 (L) 3.5 - 5.1 mmol/L   Chloride 99 (L) 101 - 111 mmol/L   CO2 29 22 - 32 mmol/L   Glucose, Bld 125 (H) 65 - 99 mg/dL   BUN 10 6 - 20 mg/dL   Creatinine, Ser 0.58 0.44 - 1.00 mg/dL   Calcium 9.2 8.9 - 10.3 mg/dL   GFR calc non Af Amer >60 >60 mL/min   GFR calc Af Amer >60 >60 mL/min   Anion gap 10 5 - 15  Glucose, capillary  Result Value Ref Range   Glucose-Capillary 125 (H) 65 - 99 mg/dL  TSH  Result Value Ref Range   TSH 1.151 0.350 - 4.500 uIU/mL  RPR  Result Value Ref Range   RPR Ser Ql Non Reactive Non Reactive  HIV antibody (routine testing) (NOT for [facility])  Result Value Ref Range   HIV Screen 4th Generation wRfx Non Reactive Non Reactive  Hepatic function panel  Result Value Ref Range   Total Protein 6.7 6.5 - 8.1 g/dL   Albumin 3.4 (L) 3.5 - 5.0 g/dL   AST 16 15 - 41 U/L   ALT 11 (L) 14 - 54 U/L   Alkaline Phosphatase 48 38 - 126 U/L   Total Bilirubin 0.9 0.3 - 1.2 mg/dL   Bilirubin, Direct 0.2 0.1 - 0.5 mg/dL   Indirect Bilirubin 0.7 0.3 - 0.9 mg/dL   Glucose, capillary  Result Value Ref Range   Glucose-Capillary 128 (H) 65 - 99 mg/dL   Comment 1 Notify RN   Glucose, capillary  Result Value Ref Range   Glucose-Capillary 113 (H) 65 - 99 mg/dL   Comment 1 Notify RN   CBC  Result Value Ref Range   WBC 9.0 4.0 - 10.5 K/uL   RBC 3.98 3.87 - 5.11 MIL/uL   Hemoglobin 11.1 (L) 12.0 - 15.0 g/dL   HCT 34.5 (L) 36.0 - 46.0 %   MCV 86.7 78.0 - 100.0 fL   MCH 27.9 26.0 - 34.0 pg   MCHC 32.2 30.0 - 36.0 g/dL   RDW 15.4 11.5 - 15.5 %   Platelets 328 150 - 400 K/uL  Basic metabolic panel  Result Value Ref Range   Sodium 133 (L) 135 - 145 mmol/L   Potassium 4.6  3.5 - 5.1 mmol/L   Chloride 95 (L) 101 - 111 mmol/L   CO2 31 22 - 32 mmol/L   Glucose, Bld 87 65 - 99 mg/dL   BUN 9 6 - 20 mg/dL   Creatinine, Ser 0.62 0.44 - 1.00 mg/dL   Calcium 8.5 (L) 8.9 - 10.3 mg/dL   GFR calc non Af Amer >60 >60 mL/min   GFR calc Af Amer >60 >60 mL/min   Anion gap 7 5 - 15  Glucose, capillary  Result Value Ref Range   Glucose-Capillary 92 65 - 99 mg/dL  Vitamin B12  Result Value Ref Range   Vitamin B-12 172 (L) 180 - 914 pg/mL  Folate RBC  Result Value Ref Range   Folate, Hemolysate 309.6 Not Estab. ng/mL   Hematocrit 33.9 (L) 34.0 - 46.6 %   Folate, RBC 913 >498 ng/mL  CBC  Result Value Ref Range   WBC 11.2 (H) 4.0 - 10.5 K/uL   RBC 3.75 (L) 3.87 - 5.11 MIL/uL   Hemoglobin 10.7 (L) 12.0 - 15.0 g/dL   HCT 32.3 (L) 36.0 - 46.0 %   MCV 86.1 78.0 - 100.0 fL   MCH 28.5 26.0 - 34.0 pg   MCHC 33.1 30.0 - 36.0 g/dL   RDW 15.3 11.5 - 15.5 %   Platelets 271 150 - 400 K/uL  Basic metabolic panel  Result Value Ref Range   Sodium 132 (L) 135 - 145 mmol/L   Potassium 4.9 3.5 - 5.1 mmol/L   Chloride 94 (L) 101 - 111 mmol/L   CO2 31 22 - 32 mmol/L   Glucose, Bld 114 (H) 65 - 99 mg/dL   BUN 10 6 - 20 mg/dL   Creatinine, Ser 0.47 0.44 - 1.00 mg/dL   Calcium 8.5 (L) 8.9 - 10.3 mg/dL   GFR calc non Af Amer >60 >60 mL/min   GFR calc Af Amer >60 >60  mL/min   Anion gap 7 5 - 15   Ct Head Wo Contrast  Result Date: 06/07/2016 CLINICAL DATA:  Initial evaluation for acute trauma, hit head on wall. Laceration to top of forehead. EXAM: CT HEAD WITHOUT CONTRAST CT CERVICAL SPINE WITHOUT CONTRAST TECHNIQUE: Multidetector CT imaging of the head and cervical spine was performed following the standard protocol without intravenous contrast. Multiplanar CT image reconstructions of the cervical spine were also generated. COMPARISON:  Prior MRI from 10/04/2015. FINDINGS: CT HEAD FINDINGS Soft tissue contusion with laceration present at the right forehead. No radiopaque foreign body. Scalp soft tissues otherwise within normal limits. No acute abnormality about the globes and orbits. Visualized paranasal sinuses are clear.  No mastoid effusion. Calvarium intact. Advanced cerebral atrophy with moderate chronic small vessel ischemic disease present. No acute intracranial hemorrhage or evidence for large vessel territory infarct. Approximately 2 cm calcified meningioma present at the right temporal pole. No associated edema or mass effect. No other mass lesion. No midline shift. No hydrocephalus. No extra-axial fluid collection. CT CERVICAL SPINE FINDINGS Straightening of the normal cervical lordosis, which may be related to positioning or muscular spasm. Vertebral body heights are preserved. Normal C1-2 articulations are intact. No prevertebral soft tissue swelling. No acute fracture or listhesis. Moderate multilevel degenerative spondylolysis as evidenced by intervertebral disc space narrowing and endplate osteophytosis present, greatest at C5-6. Visualized soft tissues of the neck demonstrate no acute abnormality. Scattered vascular calcifications about the carotid bifurcations. Visualized lung apices are clear without evidence of apical pneumothorax. IMPRESSION: CT BRAIN: 1. No acute intracranial process.  2. Right frontal scalp contusion/laceration.  No calvarial fracture.  3. Advanced cerebral atrophy with moderate chronic small vessel ischemic disease. 4. Approximately 2 cm calcified meningioma at the right temporal pole. No associated edema or mass effect. CT CERVICAL SPINE: 1. No acute traumatic injury within the cervical spine. 2. Straightening of the normal cervical lordosis, which may be related to positioning or muscular spasm. Electronically Signed   By: Jeannine Boga M.D.   On: 06/07/2016 06:06   Ct Cervical Spine Wo Contrast  Result Date: 06/07/2016 CLINICAL DATA:  Initial evaluation for acute trauma, hit head on wall. Laceration to top of forehead. EXAM: CT HEAD WITHOUT CONTRAST CT CERVICAL SPINE WITHOUT CONTRAST TECHNIQUE: Multidetector CT imaging of the head and cervical spine was performed following the standard protocol without intravenous contrast. Multiplanar CT image reconstructions of the cervical spine were also generated. COMPARISON:  Prior MRI from 10/04/2015. FINDINGS: CT HEAD FINDINGS Soft tissue contusion with laceration present at the right forehead. No radiopaque foreign body. Scalp soft tissues otherwise within normal limits. No acute abnormality about the globes and orbits. Visualized paranasal sinuses are clear.  No mastoid effusion. Calvarium intact. Advanced cerebral atrophy with moderate chronic small vessel ischemic disease present. No acute intracranial hemorrhage or evidence for large vessel territory infarct. Approximately 2 cm calcified meningioma present at the right temporal pole. No associated edema or mass effect. No other mass lesion. No midline shift. No hydrocephalus. No extra-axial fluid collection. CT CERVICAL SPINE FINDINGS Straightening of the normal cervical lordosis, which may be related to positioning or muscular spasm. Vertebral body heights are preserved. Normal C1-2 articulations are intact. No prevertebral soft tissue swelling. No acute fracture or listhesis. Moderate multilevel degenerative spondylolysis as evidenced  by intervertebral disc space narrowing and endplate osteophytosis present, greatest at C5-6. Visualized soft tissues of the neck demonstrate no acute abnormality. Scattered vascular calcifications about the carotid bifurcations. Visualized lung apices are clear without evidence of apical pneumothorax. IMPRESSION: CT BRAIN: 1. No acute intracranial process. 2. Right frontal scalp contusion/laceration.  No calvarial fracture. 3. Advanced cerebral atrophy with moderate chronic small vessel ischemic disease. 4. Approximately 2 cm calcified meningioma at the right temporal pole. No associated edema or mass effect. CT CERVICAL SPINE: 1. No acute traumatic injury within the cervical spine. 2. Straightening of the normal cervical lordosis, which may be related to positioning or muscular spasm. Electronically Signed   By: Jeannine Boga M.D.   On: 06/07/2016 06:06     Final Clinical Impressions(s) / ED Diagnoses   Final diagnoses:  None    New Prescriptions New Prescriptions   No medications on file   Discharged in stable condition   Amparo Donalson, MD 06/15/16 2304

## 2016-06-07 NOTE — ED Notes (Signed)
Please call pt's daughter when pt is d/c.  Margaretha Sheffield can be reached at [phone].

## 2016-06-07 NOTE — ED Triage Notes (Signed)
Pt comes to ed via ems, pt was walking and loss her balance and fell and hit her head on the wall at home. laceration half inch/ top of forehead,  Abrasions to right lower leg to shift from fall.  Pt hx of HTN. No LOC noted. Some bleeding, but not active at the moment. Pt comes from home, family in room.  V/s on arrival 158/100, hr 100, irreg PVC, 97 room air spo2, cbg 136.  Rr18.  Alert x4.  Not on blood thinners, allergies penicillin

## 2016-06-15 DIAGNOSIS — S0101XD Laceration without foreign body of scalp, subsequent encounter: Secondary | ICD-10-CM | POA: Diagnosis not present

## 2016-06-15 DIAGNOSIS — R296 Repeated falls: Secondary | ICD-10-CM | POA: Diagnosis not present

## 2016-06-18 DIAGNOSIS — Z9181 History of falling: Secondary | ICD-10-CM | POA: Diagnosis not present

## 2016-06-18 DIAGNOSIS — I495 Sick sinus syndrome: Secondary | ICD-10-CM | POA: Diagnosis not present

## 2016-06-18 DIAGNOSIS — H409 Unspecified glaucoma: Secondary | ICD-10-CM | POA: Diagnosis not present

## 2016-06-18 DIAGNOSIS — F419 Anxiety disorder, unspecified: Secondary | ICD-10-CM | POA: Diagnosis not present

## 2016-06-18 DIAGNOSIS — R2681 Unsteadiness on feet: Secondary | ICD-10-CM | POA: Diagnosis not present

## 2016-06-18 DIAGNOSIS — I10 Essential (primary) hypertension: Secondary | ICD-10-CM | POA: Diagnosis not present

## 2016-06-18 DIAGNOSIS — R531 Weakness: Secondary | ICD-10-CM | POA: Diagnosis not present

## 2016-06-21 DIAGNOSIS — R296 Repeated falls: Secondary | ICD-10-CM | POA: Diagnosis not present

## 2016-06-21 DIAGNOSIS — Z111 Encounter for screening for respiratory tuberculosis: Secondary | ICD-10-CM | POA: Diagnosis not present

## 2016-06-21 DIAGNOSIS — R29898 Other symptoms and signs involving the musculoskeletal system: Secondary | ICD-10-CM | POA: Diagnosis not present

## 2016-06-28 DIAGNOSIS — I10 Essential (primary) hypertension: Secondary | ICD-10-CM | POA: Diagnosis not present

## 2016-06-28 DIAGNOSIS — R2681 Unsteadiness on feet: Secondary | ICD-10-CM | POA: Diagnosis not present

## 2016-06-28 DIAGNOSIS — F419 Anxiety disorder, unspecified: Secondary | ICD-10-CM | POA: Diagnosis not present

## 2016-06-28 DIAGNOSIS — I495 Sick sinus syndrome: Secondary | ICD-10-CM | POA: Diagnosis not present

## 2016-06-28 DIAGNOSIS — R531 Weakness: Secondary | ICD-10-CM | POA: Diagnosis not present

## 2016-06-28 DIAGNOSIS — Z9181 History of falling: Secondary | ICD-10-CM | POA: Diagnosis not present

## 2016-06-29 DIAGNOSIS — Z9181 History of falling: Secondary | ICD-10-CM | POA: Diagnosis not present

## 2016-06-29 DIAGNOSIS — I495 Sick sinus syndrome: Secondary | ICD-10-CM | POA: Diagnosis not present

## 2016-06-29 DIAGNOSIS — R531 Weakness: Secondary | ICD-10-CM | POA: Diagnosis not present

## 2016-06-29 DIAGNOSIS — I10 Essential (primary) hypertension: Secondary | ICD-10-CM | POA: Diagnosis not present

## 2016-06-29 DIAGNOSIS — F419 Anxiety disorder, unspecified: Secondary | ICD-10-CM | POA: Diagnosis not present

## 2016-06-29 DIAGNOSIS — R2681 Unsteadiness on feet: Secondary | ICD-10-CM | POA: Diagnosis not present

## 2016-07-03 DIAGNOSIS — I495 Sick sinus syndrome: Secondary | ICD-10-CM | POA: Diagnosis not present

## 2016-07-03 DIAGNOSIS — I10 Essential (primary) hypertension: Secondary | ICD-10-CM | POA: Diagnosis not present

## 2016-07-03 DIAGNOSIS — Z9181 History of falling: Secondary | ICD-10-CM | POA: Diagnosis not present

## 2016-07-03 DIAGNOSIS — F419 Anxiety disorder, unspecified: Secondary | ICD-10-CM | POA: Diagnosis not present

## 2016-07-03 DIAGNOSIS — R531 Weakness: Secondary | ICD-10-CM | POA: Diagnosis not present

## 2016-07-03 DIAGNOSIS — R2681 Unsteadiness on feet: Secondary | ICD-10-CM | POA: Diagnosis not present

## 2016-07-04 DIAGNOSIS — R531 Weakness: Secondary | ICD-10-CM | POA: Diagnosis not present

## 2016-07-04 DIAGNOSIS — I495 Sick sinus syndrome: Secondary | ICD-10-CM | POA: Diagnosis not present

## 2016-07-04 DIAGNOSIS — Z9181 History of falling: Secondary | ICD-10-CM | POA: Diagnosis not present

## 2016-07-04 DIAGNOSIS — I10 Essential (primary) hypertension: Secondary | ICD-10-CM | POA: Diagnosis not present

## 2016-07-04 DIAGNOSIS — F419 Anxiety disorder, unspecified: Secondary | ICD-10-CM | POA: Diagnosis not present

## 2016-07-04 DIAGNOSIS — R2681 Unsteadiness on feet: Secondary | ICD-10-CM | POA: Diagnosis not present

## 2016-07-06 DIAGNOSIS — I10 Essential (primary) hypertension: Secondary | ICD-10-CM | POA: Diagnosis not present

## 2016-07-06 DIAGNOSIS — Z9181 History of falling: Secondary | ICD-10-CM | POA: Diagnosis not present

## 2016-07-06 DIAGNOSIS — I495 Sick sinus syndrome: Secondary | ICD-10-CM | POA: Diagnosis not present

## 2016-07-06 DIAGNOSIS — F419 Anxiety disorder, unspecified: Secondary | ICD-10-CM | POA: Diagnosis not present

## 2016-07-06 DIAGNOSIS — R531 Weakness: Secondary | ICD-10-CM | POA: Diagnosis not present

## 2016-07-06 DIAGNOSIS — R2681 Unsteadiness on feet: Secondary | ICD-10-CM | POA: Diagnosis not present

## 2016-07-07 DIAGNOSIS — I495 Sick sinus syndrome: Secondary | ICD-10-CM | POA: Diagnosis not present

## 2016-07-07 DIAGNOSIS — I10 Essential (primary) hypertension: Secondary | ICD-10-CM | POA: Diagnosis not present

## 2016-07-07 DIAGNOSIS — Z9181 History of falling: Secondary | ICD-10-CM | POA: Diagnosis not present

## 2016-07-07 DIAGNOSIS — R2681 Unsteadiness on feet: Secondary | ICD-10-CM | POA: Diagnosis not present

## 2016-07-07 DIAGNOSIS — F419 Anxiety disorder, unspecified: Secondary | ICD-10-CM | POA: Diagnosis not present

## 2016-07-07 DIAGNOSIS — R531 Weakness: Secondary | ICD-10-CM | POA: Diagnosis not present

## 2016-07-08 DIAGNOSIS — N390 Urinary tract infection, site not specified: Secondary | ICD-10-CM | POA: Diagnosis not present

## 2016-07-10 DIAGNOSIS — I10 Essential (primary) hypertension: Secondary | ICD-10-CM | POA: Diagnosis not present

## 2016-07-10 DIAGNOSIS — R2681 Unsteadiness on feet: Secondary | ICD-10-CM | POA: Diagnosis not present

## 2016-07-10 DIAGNOSIS — Z9181 History of falling: Secondary | ICD-10-CM | POA: Diagnosis not present

## 2016-07-10 DIAGNOSIS — R531 Weakness: Secondary | ICD-10-CM | POA: Diagnosis not present

## 2016-07-10 DIAGNOSIS — F419 Anxiety disorder, unspecified: Secondary | ICD-10-CM | POA: Diagnosis not present

## 2016-07-10 DIAGNOSIS — I495 Sick sinus syndrome: Secondary | ICD-10-CM | POA: Diagnosis not present

## 2016-07-12 DIAGNOSIS — I10 Essential (primary) hypertension: Secondary | ICD-10-CM | POA: Diagnosis not present

## 2016-07-12 DIAGNOSIS — I495 Sick sinus syndrome: Secondary | ICD-10-CM | POA: Diagnosis not present

## 2016-07-12 DIAGNOSIS — R531 Weakness: Secondary | ICD-10-CM | POA: Diagnosis not present

## 2016-07-12 DIAGNOSIS — R2681 Unsteadiness on feet: Secondary | ICD-10-CM | POA: Diagnosis not present

## 2016-07-12 DIAGNOSIS — Z9181 History of falling: Secondary | ICD-10-CM | POA: Diagnosis not present

## 2016-07-12 DIAGNOSIS — F419 Anxiety disorder, unspecified: Secondary | ICD-10-CM | POA: Diagnosis not present

## 2016-07-14 DIAGNOSIS — R2681 Unsteadiness on feet: Secondary | ICD-10-CM | POA: Diagnosis not present

## 2016-07-14 DIAGNOSIS — R531 Weakness: Secondary | ICD-10-CM | POA: Diagnosis not present

## 2016-07-14 DIAGNOSIS — F419 Anxiety disorder, unspecified: Secondary | ICD-10-CM | POA: Diagnosis not present

## 2016-07-14 DIAGNOSIS — I495 Sick sinus syndrome: Secondary | ICD-10-CM | POA: Diagnosis not present

## 2016-07-14 DIAGNOSIS — I10 Essential (primary) hypertension: Secondary | ICD-10-CM | POA: Diagnosis not present

## 2016-07-14 DIAGNOSIS — Z9181 History of falling: Secondary | ICD-10-CM | POA: Diagnosis not present

## 2016-07-17 DIAGNOSIS — R2681 Unsteadiness on feet: Secondary | ICD-10-CM | POA: Diagnosis not present

## 2016-07-17 DIAGNOSIS — M6281 Muscle weakness (generalized): Secondary | ICD-10-CM | POA: Diagnosis not present

## 2016-07-18 DIAGNOSIS — M6281 Muscle weakness (generalized): Secondary | ICD-10-CM | POA: Diagnosis not present

## 2016-07-18 DIAGNOSIS — R2681 Unsteadiness on feet: Secondary | ICD-10-CM | POA: Diagnosis not present

## 2016-07-19 DIAGNOSIS — M6281 Muscle weakness (generalized): Secondary | ICD-10-CM | POA: Diagnosis not present

## 2016-07-19 DIAGNOSIS — R2681 Unsteadiness on feet: Secondary | ICD-10-CM | POA: Diagnosis not present

## 2016-07-20 DIAGNOSIS — R2681 Unsteadiness on feet: Secondary | ICD-10-CM | POA: Diagnosis not present

## 2016-07-20 DIAGNOSIS — M6281 Muscle weakness (generalized): Secondary | ICD-10-CM | POA: Diagnosis not present

## 2016-07-21 DIAGNOSIS — M6281 Muscle weakness (generalized): Secondary | ICD-10-CM | POA: Diagnosis not present

## 2016-07-21 DIAGNOSIS — R2681 Unsteadiness on feet: Secondary | ICD-10-CM | POA: Diagnosis not present

## 2016-07-24 DIAGNOSIS — R2681 Unsteadiness on feet: Secondary | ICD-10-CM | POA: Diagnosis not present

## 2016-07-24 DIAGNOSIS — M6281 Muscle weakness (generalized): Secondary | ICD-10-CM | POA: Diagnosis not present

## 2016-07-25 DIAGNOSIS — M6281 Muscle weakness (generalized): Secondary | ICD-10-CM | POA: Diagnosis not present

## 2016-07-25 DIAGNOSIS — R2681 Unsteadiness on feet: Secondary | ICD-10-CM | POA: Diagnosis not present

## 2016-07-26 DIAGNOSIS — M6281 Muscle weakness (generalized): Secondary | ICD-10-CM | POA: Diagnosis not present

## 2016-07-26 DIAGNOSIS — R2681 Unsteadiness on feet: Secondary | ICD-10-CM | POA: Diagnosis not present

## 2016-07-27 DIAGNOSIS — R2681 Unsteadiness on feet: Secondary | ICD-10-CM | POA: Diagnosis not present

## 2016-07-27 DIAGNOSIS — M6281 Muscle weakness (generalized): Secondary | ICD-10-CM | POA: Diagnosis not present

## 2016-07-28 DIAGNOSIS — R2681 Unsteadiness on feet: Secondary | ICD-10-CM | POA: Diagnosis not present

## 2016-07-28 DIAGNOSIS — M6281 Muscle weakness (generalized): Secondary | ICD-10-CM | POA: Diagnosis not present

## 2016-07-31 DIAGNOSIS — R2681 Unsteadiness on feet: Secondary | ICD-10-CM | POA: Diagnosis not present

## 2016-07-31 DIAGNOSIS — M6281 Muscle weakness (generalized): Secondary | ICD-10-CM | POA: Diagnosis not present

## 2016-08-10 DIAGNOSIS — F4321 Adjustment disorder with depressed mood: Secondary | ICD-10-CM | POA: Diagnosis not present

## 2016-08-30 DIAGNOSIS — R2681 Unsteadiness on feet: Secondary | ICD-10-CM | POA: Diagnosis not present

## 2016-08-30 DIAGNOSIS — M6281 Muscle weakness (generalized): Secondary | ICD-10-CM | POA: Diagnosis not present

## 2016-09-01 DIAGNOSIS — R2681 Unsteadiness on feet: Secondary | ICD-10-CM | POA: Diagnosis not present

## 2016-09-01 DIAGNOSIS — M6281 Muscle weakness (generalized): Secondary | ICD-10-CM | POA: Diagnosis not present

## 2016-09-04 DIAGNOSIS — R2681 Unsteadiness on feet: Secondary | ICD-10-CM | POA: Diagnosis not present

## 2016-09-04 DIAGNOSIS — M6281 Muscle weakness (generalized): Secondary | ICD-10-CM | POA: Diagnosis not present

## 2016-09-06 DIAGNOSIS — R2681 Unsteadiness on feet: Secondary | ICD-10-CM | POA: Diagnosis not present

## 2016-09-06 DIAGNOSIS — M6281 Muscle weakness (generalized): Secondary | ICD-10-CM | POA: Diagnosis not present

## 2016-09-07 DIAGNOSIS — M6281 Muscle weakness (generalized): Secondary | ICD-10-CM | POA: Diagnosis not present

## 2016-09-07 DIAGNOSIS — R2681 Unsteadiness on feet: Secondary | ICD-10-CM | POA: Diagnosis not present

## 2016-09-08 DIAGNOSIS — M6281 Muscle weakness (generalized): Secondary | ICD-10-CM | POA: Diagnosis not present

## 2016-09-08 DIAGNOSIS — R2681 Unsteadiness on feet: Secondary | ICD-10-CM | POA: Diagnosis not present

## 2016-09-11 DIAGNOSIS — R2681 Unsteadiness on feet: Secondary | ICD-10-CM | POA: Diagnosis not present

## 2016-09-11 DIAGNOSIS — M6281 Muscle weakness (generalized): Secondary | ICD-10-CM | POA: Diagnosis not present

## 2016-09-13 DIAGNOSIS — R2681 Unsteadiness on feet: Secondary | ICD-10-CM | POA: Diagnosis not present

## 2016-09-13 DIAGNOSIS — M6281 Muscle weakness (generalized): Secondary | ICD-10-CM | POA: Diagnosis not present

## 2016-09-15 DIAGNOSIS — R2681 Unsteadiness on feet: Secondary | ICD-10-CM | POA: Diagnosis not present

## 2016-09-15 DIAGNOSIS — M6281 Muscle weakness (generalized): Secondary | ICD-10-CM | POA: Diagnosis not present

## 2016-09-18 DIAGNOSIS — M6281 Muscle weakness (generalized): Secondary | ICD-10-CM | POA: Diagnosis not present

## 2016-09-18 DIAGNOSIS — R2681 Unsteadiness on feet: Secondary | ICD-10-CM | POA: Diagnosis not present

## 2016-09-19 DIAGNOSIS — E7800 Pure hypercholesterolemia, unspecified: Secondary | ICD-10-CM | POA: Diagnosis not present

## 2016-09-19 DIAGNOSIS — E559 Vitamin D deficiency, unspecified: Secondary | ICD-10-CM | POA: Diagnosis not present

## 2016-09-19 DIAGNOSIS — I10 Essential (primary) hypertension: Secondary | ICD-10-CM | POA: Diagnosis not present

## 2016-09-19 DIAGNOSIS — F4323 Adjustment disorder with mixed anxiety and depressed mood: Secondary | ICD-10-CM | POA: Diagnosis not present

## 2016-09-19 DIAGNOSIS — H409 Unspecified glaucoma: Secondary | ICD-10-CM | POA: Diagnosis not present

## 2016-09-19 DIAGNOSIS — M150 Primary generalized (osteo)arthritis: Secondary | ICD-10-CM | POA: Diagnosis not present

## 2016-09-19 DIAGNOSIS — Z96619 Presence of unspecified artificial shoulder joint: Secondary | ICD-10-CM | POA: Diagnosis not present

## 2016-09-19 DIAGNOSIS — G479 Sleep disorder, unspecified: Secondary | ICD-10-CM | POA: Diagnosis not present

## 2016-09-20 DIAGNOSIS — R2681 Unsteadiness on feet: Secondary | ICD-10-CM | POA: Diagnosis not present

## 2016-09-20 DIAGNOSIS — M6281 Muscle weakness (generalized): Secondary | ICD-10-CM | POA: Diagnosis not present

## 2016-09-22 DIAGNOSIS — M6281 Muscle weakness (generalized): Secondary | ICD-10-CM | POA: Diagnosis not present

## 2016-09-22 DIAGNOSIS — R2681 Unsteadiness on feet: Secondary | ICD-10-CM | POA: Diagnosis not present

## 2016-09-25 DIAGNOSIS — R2681 Unsteadiness on feet: Secondary | ICD-10-CM | POA: Diagnosis not present

## 2016-09-25 DIAGNOSIS — M6281 Muscle weakness (generalized): Secondary | ICD-10-CM | POA: Diagnosis not present

## 2016-09-26 DIAGNOSIS — M6281 Muscle weakness (generalized): Secondary | ICD-10-CM | POA: Diagnosis not present

## 2016-09-26 DIAGNOSIS — R2681 Unsteadiness on feet: Secondary | ICD-10-CM | POA: Diagnosis not present

## 2016-09-27 DIAGNOSIS — R2681 Unsteadiness on feet: Secondary | ICD-10-CM | POA: Diagnosis not present

## 2016-09-27 DIAGNOSIS — M6281 Muscle weakness (generalized): Secondary | ICD-10-CM | POA: Diagnosis not present

## 2016-09-29 DIAGNOSIS — R2681 Unsteadiness on feet: Secondary | ICD-10-CM | POA: Diagnosis not present

## 2016-09-29 DIAGNOSIS — M6281 Muscle weakness (generalized): Secondary | ICD-10-CM | POA: Diagnosis not present

## 2016-10-02 DIAGNOSIS — M6281 Muscle weakness (generalized): Secondary | ICD-10-CM | POA: Diagnosis not present

## 2016-10-02 DIAGNOSIS — R2681 Unsteadiness on feet: Secondary | ICD-10-CM | POA: Diagnosis not present

## 2016-10-04 DIAGNOSIS — R2681 Unsteadiness on feet: Secondary | ICD-10-CM | POA: Diagnosis not present

## 2016-10-04 DIAGNOSIS — M6281 Muscle weakness (generalized): Secondary | ICD-10-CM | POA: Diagnosis not present

## 2016-10-06 DIAGNOSIS — R2681 Unsteadiness on feet: Secondary | ICD-10-CM | POA: Diagnosis not present

## 2016-10-06 DIAGNOSIS — M6281 Muscle weakness (generalized): Secondary | ICD-10-CM | POA: Diagnosis not present

## 2016-10-09 DIAGNOSIS — M6281 Muscle weakness (generalized): Secondary | ICD-10-CM | POA: Diagnosis not present

## 2016-10-09 DIAGNOSIS — R2681 Unsteadiness on feet: Secondary | ICD-10-CM | POA: Diagnosis not present

## 2016-10-10 DIAGNOSIS — R2681 Unsteadiness on feet: Secondary | ICD-10-CM | POA: Diagnosis not present

## 2016-10-10 DIAGNOSIS — M6281 Muscle weakness (generalized): Secondary | ICD-10-CM | POA: Diagnosis not present

## 2016-10-11 DIAGNOSIS — M6281 Muscle weakness (generalized): Secondary | ICD-10-CM | POA: Diagnosis not present

## 2016-10-11 DIAGNOSIS — R2681 Unsteadiness on feet: Secondary | ICD-10-CM | POA: Diagnosis not present

## 2016-10-12 DIAGNOSIS — H53032 Strabismic amblyopia, left eye: Secondary | ICD-10-CM | POA: Diagnosis not present

## 2016-10-12 DIAGNOSIS — H524 Presbyopia: Secondary | ICD-10-CM | POA: Diagnosis not present

## 2016-10-12 DIAGNOSIS — H401131 Primary open-angle glaucoma, bilateral, mild stage: Secondary | ICD-10-CM | POA: Diagnosis not present

## 2016-10-12 DIAGNOSIS — Z961 Presence of intraocular lens: Secondary | ICD-10-CM | POA: Diagnosis not present

## 2016-10-31 DIAGNOSIS — R159 Full incontinence of feces: Secondary | ICD-10-CM | POA: Diagnosis not present

## 2016-10-31 DIAGNOSIS — E559 Vitamin D deficiency, unspecified: Secondary | ICD-10-CM | POA: Diagnosis not present

## 2016-10-31 DIAGNOSIS — I10 Essential (primary) hypertension: Secondary | ICD-10-CM | POA: Diagnosis not present

## 2016-10-31 DIAGNOSIS — R32 Unspecified urinary incontinence: Secondary | ICD-10-CM | POA: Diagnosis not present

## 2016-11-01 DIAGNOSIS — M6281 Muscle weakness (generalized): Secondary | ICD-10-CM | POA: Diagnosis not present

## 2016-11-01 DIAGNOSIS — M150 Primary generalized (osteo)arthritis: Secondary | ICD-10-CM | POA: Diagnosis not present

## 2016-11-06 IMAGING — CT CT HEAD W/O CM
4 of 8 series · 15 of 47 positions shown, 17 images · non-contrast
Comparison: Prior MRI from 10/04/2015.

CLINICAL DATA: Initial evaluation for acute trauma, hit head on
wall. Laceration to top of forehead.

EXAM:
CT HEAD WITHOUT CONTRAST
CT CERVICAL SPINE WITHOUT CONTRAST
TECHNIQUE: Multidetector CT imaging of the head and cervical spine was
performed following the standard protocol without intravenous
contrast. Multiplanar CT image reconstructions of the cervical spine
were also generated.

[Series 3: head w/o · axial · non-contrast · 0.43mm/px · z∈[-88,-38]mm · 2 of 32 slices shown]
[im 11/32  brain]
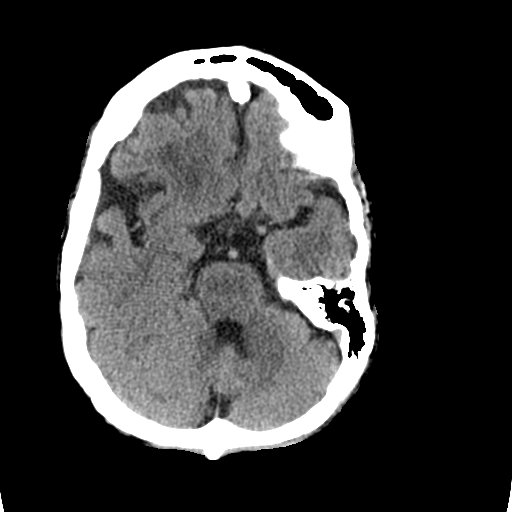
[im 21/32  brain]
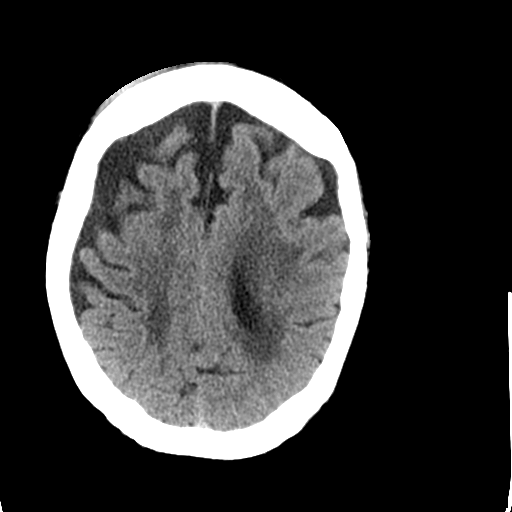

[Series 5: coronal · coronal · 0.29mm/px · 3 of 60 slices shown]
[im 23/60  brain]
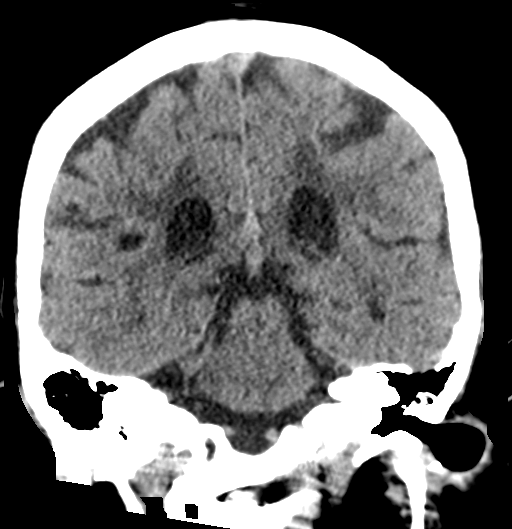
[im 30/60  brain]
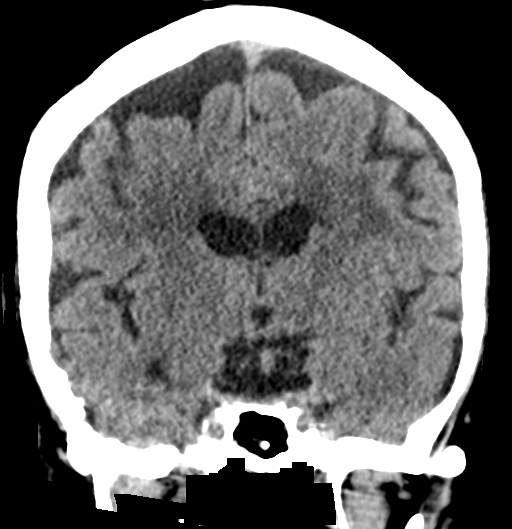
[im 37/60  brain]
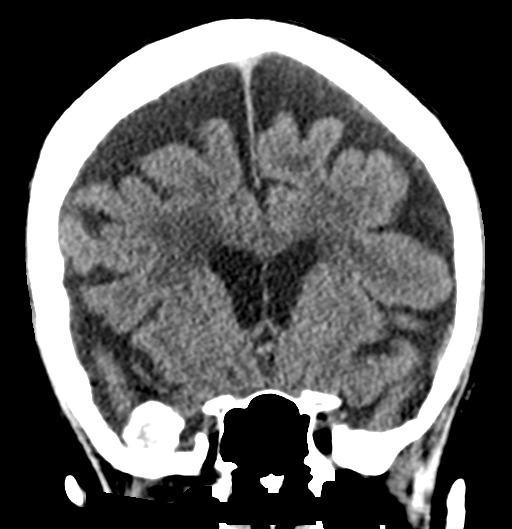

[Series 6: sagittal · sagittal · 0.30mm/px · 2 of 46 slices shown]
[im 16/46  brain]
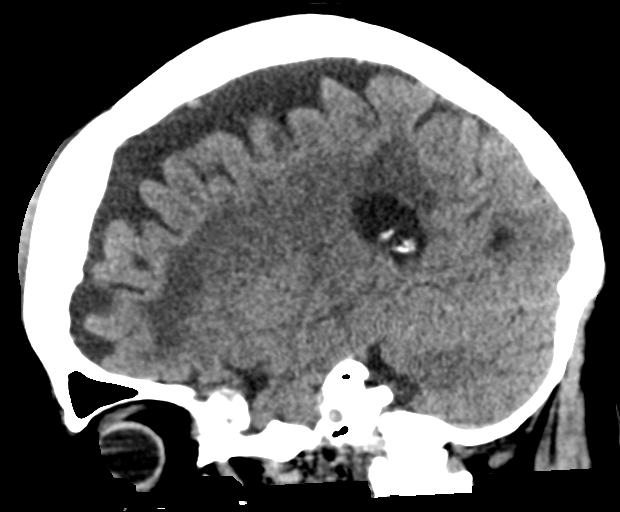
[im 31/46  brain]
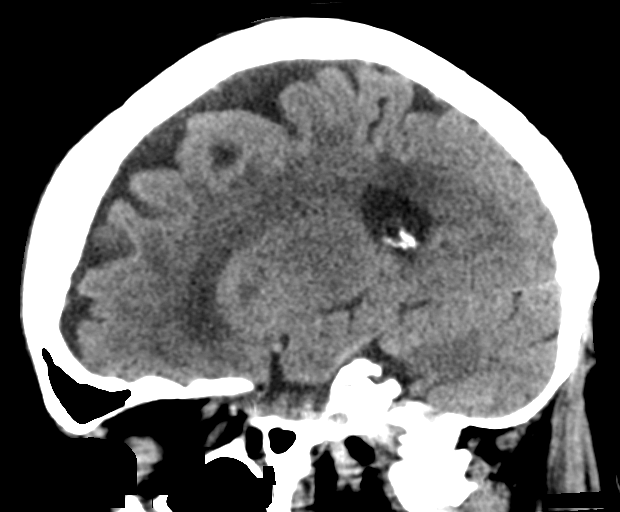

[Series 9: axial recon · axial · 0.17mm/px · z∈[-285,-157]mm · 8 of 92 slices shown, 10 images]
[im 11/92  brain]
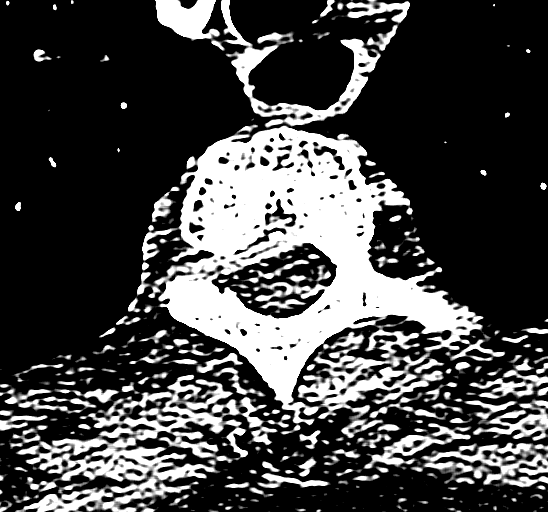
[im 11/92  bone]
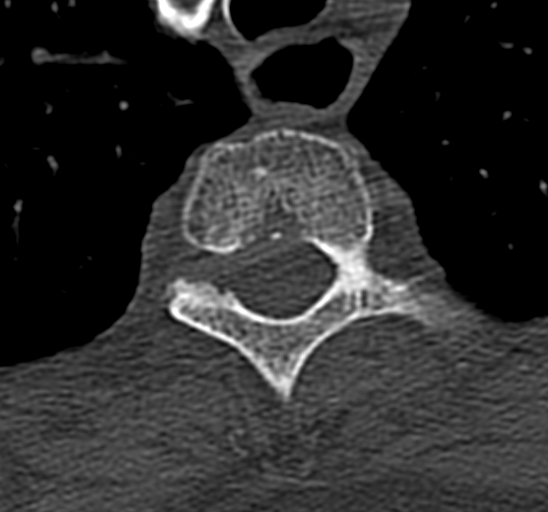
[im 21/92  brain]
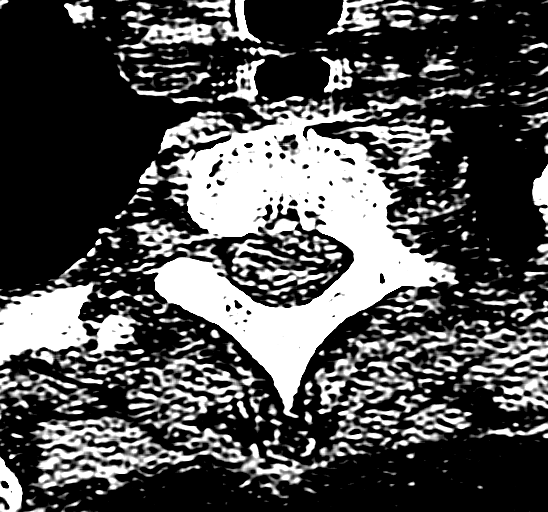
[im 31/92  brain]
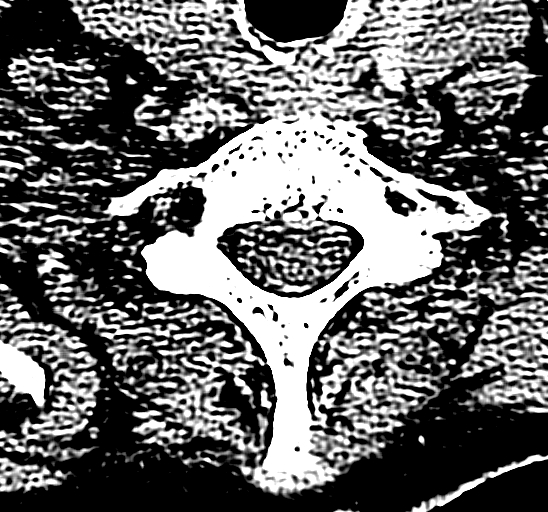
[im 41/92  brain]
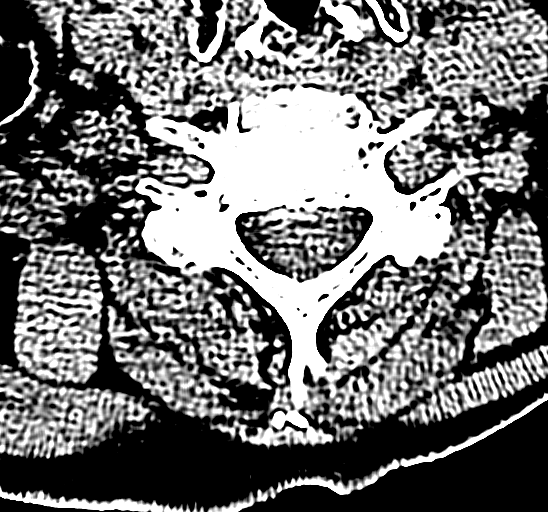
[im 51/92  brain]
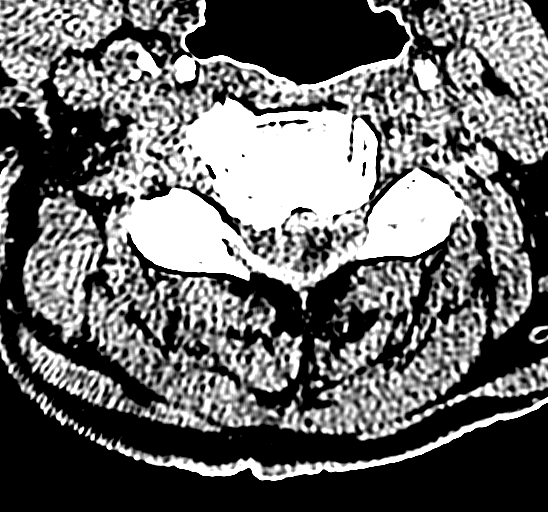
[im 51/92  bone]
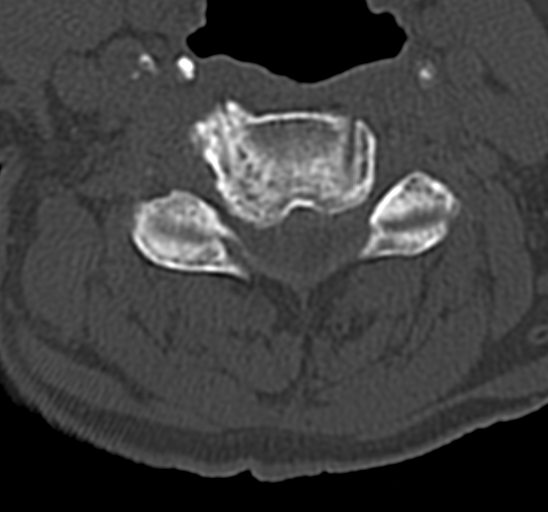
[im 61/92  brain]
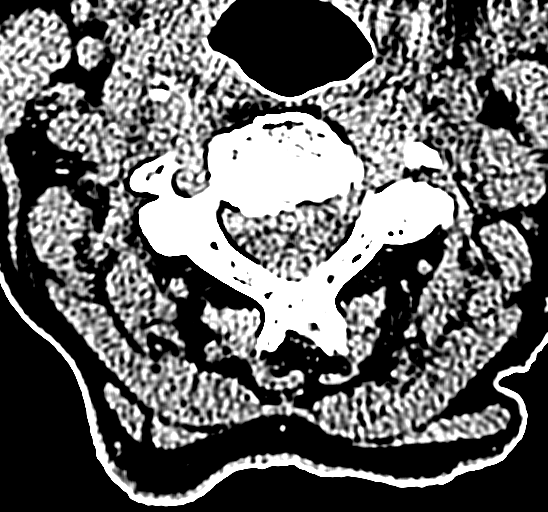
[im 71/92  brain]
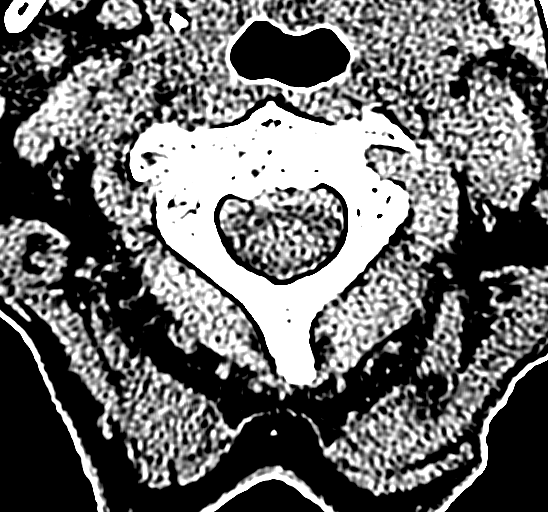
[im 81/92  brain]
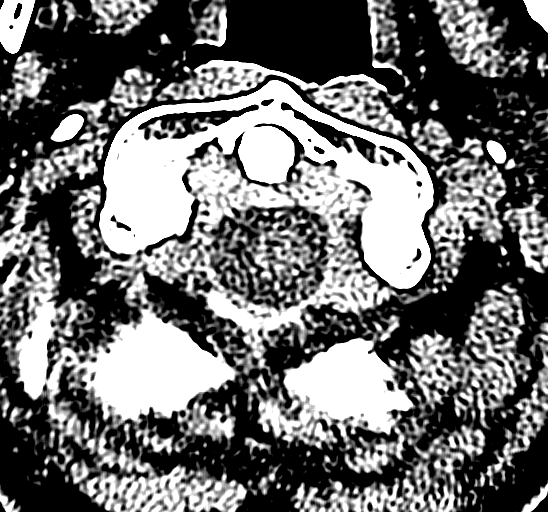

[15 of 47 positions shown; findings below may reference images not displayed]

FINDINGS: CT HEAD FINDINGS

Soft tissue contusion with laceration present at the right forehead.
No radiopaque foreign body. Scalp soft tissues otherwise within
normal limits.

No acute abnormality about the globes and orbits.

Visualized paranasal sinuses are clear.  No mastoid effusion.

Calvarium intact.

Advanced cerebral atrophy with moderate chronic small vessel
ischemic disease present. No acute intracranial hemorrhage or
evidence for large vessel territory infarct. Approximately 2 cm
calcified meningioma present at the right temporal pole. No
associated edema or mass effect. No other mass lesion. No midline
shift. No hydrocephalus. No extra-axial fluid collection.

CT CERVICAL SPINE FINDINGS

Straightening of the normal cervical lordosis, which may be related
to positioning or muscular spasm. Vertebral body heights are
preserved. Normal C1-2 articulations are intact. No prevertebral
soft tissue swelling. No acute fracture or listhesis.

Moderate multilevel degenerative spondylolysis as evidenced by
intervertebral disc space narrowing and endplate osteophytosis
present, greatest at C5-6.

Visualized soft tissues of the neck demonstrate no acute
abnormality. Scattered vascular calcifications about the carotid
bifurcations. Visualized lung apices are clear without evidence of
apical pneumothorax.
IMPRESSION: CT BRAIN:

1. No acute intracranial process.
2. Right frontal scalp contusion/laceration.  No calvarial fracture.
3. Advanced cerebral atrophy with moderate chronic small vessel
ischemic disease.
4. Approximately 2 cm calcified meningioma at the right temporal
pole. No associated edema or mass effect.

CT CERVICAL SPINE:

1. No acute traumatic injury within the cervical spine.
2. Straightening of the normal cervical lordosis, which may be
related to positioning or muscular spasm.

## 2016-12-27 DIAGNOSIS — F4310 Post-traumatic stress disorder, unspecified: Secondary | ICD-10-CM | POA: Diagnosis not present

## 2016-12-27 DIAGNOSIS — M150 Primary generalized (osteo)arthritis: Secondary | ICD-10-CM | POA: Diagnosis not present

## 2016-12-27 DIAGNOSIS — H04129 Dry eye syndrome of unspecified lacrimal gland: Secondary | ICD-10-CM | POA: Diagnosis not present

## 2016-12-27 DIAGNOSIS — R159 Full incontinence of feces: Secondary | ICD-10-CM | POA: Diagnosis not present

## 2017-01-28 DEATH — deceased
